# Patient Record
Sex: Female | Born: 1990 | Race: Black or African American | Hispanic: No | Marital: Single | Smoking: Never smoker
Health system: Southern US, Community
[De-identification: ages and names within clinical notes are randomized; demographics above are authoritative.]

## PROBLEM LIST (undated history)

## (undated) DIAGNOSIS — Z8619 Personal history of other infectious and parasitic diseases: Secondary | ICD-10-CM

## (undated) HISTORY — DX: Personal history of other infectious and parasitic diseases: Z86.19

## (undated) HISTORY — PX: MYRINGOTOMY: SUR874

---

## 2011-08-09 NOTE — L&D Delivery Note (Signed)
Delivery Note At 7:23 PM a viable female was delivered via Vaginal, Spontaneous Delivery (Presentation: ;  ).  APGAR: , ; weight .   Placenta status: , Spontaneous.  Cord:  with the following complications: .  Cord pH: not done  Anesthesia:   Episiotomy:  Lacerations: 1st degree;Perineal Suture Repair: 2.0 vicryl Est. Blood Loss (mL): 250  Mom to postpartum.  Baby to nursery-stable.  Tarina Volk A 04/17/2012, 7:30 PM

## 2011-10-14 LAB — OB RESULTS CONSOLE ANTIBODY SCREEN: Antibody Screen: NEGATIVE

## 2011-10-14 LAB — OB RESULTS CONSOLE ABO/RH: RH Type: POSITIVE

## 2011-11-09 ENCOUNTER — Other Ambulatory Visit (HOSPITAL_COMMUNITY): Payer: Self-pay | Admitting: Obstetrics

## 2011-11-09 DIAGNOSIS — Z3689 Encounter for other specified antenatal screening: Secondary | ICD-10-CM

## 2011-11-16 ENCOUNTER — Ambulatory Visit (HOSPITAL_COMMUNITY)
Admission: RE | Admit: 2011-11-16 | Discharge: 2011-11-16 | Disposition: A | Discharge status: Home / Self Care | Payer: Medicaid Other | Source: Ambulatory Visit | Attending: Obstetrics | Admitting: Obstetrics

## 2011-11-16 DIAGNOSIS — E669 Obesity, unspecified: Secondary | ICD-10-CM | POA: Insufficient documentation

## 2011-11-16 DIAGNOSIS — Z1389 Encounter for screening for other disorder: Secondary | ICD-10-CM | POA: Insufficient documentation

## 2011-11-16 DIAGNOSIS — O358XX0 Maternal care for other (suspected) fetal abnormality and damage, not applicable or unspecified: Secondary | ICD-10-CM | POA: Insufficient documentation

## 2011-11-16 DIAGNOSIS — Z363 Encounter for antenatal screening for malformations: Secondary | ICD-10-CM | POA: Insufficient documentation

## 2011-11-16 DIAGNOSIS — O99210 Obesity complicating pregnancy, unspecified trimester: Secondary | ICD-10-CM | POA: Insufficient documentation

## 2011-11-16 DIAGNOSIS — Z3689 Encounter for other specified antenatal screening: Secondary | ICD-10-CM

## 2012-03-02 LAB — OB RESULTS CONSOLE GBS: GBS: POSITIVE

## 2012-04-12 ENCOUNTER — Encounter (HOSPITAL_COMMUNITY): Payer: Self-pay | Admitting: *Deleted

## 2012-04-12 ENCOUNTER — Inpatient Hospital Stay (HOSPITAL_COMMUNITY)
Admission: AD | Admit: 2012-04-12 | Discharge: 2012-04-12 | Disposition: A | Discharge status: Home / Self Care | Payer: 59 | Source: Ambulatory Visit | Attending: Obstetrics | Admitting: Obstetrics

## 2012-04-12 ENCOUNTER — Telehealth (HOSPITAL_COMMUNITY): Payer: Self-pay | Admitting: *Deleted

## 2012-04-12 DIAGNOSIS — W108XXA Fall (on) (from) other stairs and steps, initial encounter: Secondary | ICD-10-CM | POA: Insufficient documentation

## 2012-04-12 DIAGNOSIS — O368190 Decreased fetal movements, unspecified trimester, not applicable or unspecified: Secondary | ICD-10-CM

## 2012-04-12 DIAGNOSIS — Z3689 Encounter for other specified antenatal screening: Secondary | ICD-10-CM

## 2012-04-12 DIAGNOSIS — W19XXXA Unspecified fall, initial encounter: Secondary | ICD-10-CM

## 2012-04-12 NOTE — Telephone Encounter (Signed)
Preadmission screen During interview pt stated she fell last night.  Was seen at [facility] hospital.  They dopplered the fht but the baby had not been moving much since the fall.  Told pt to call Dr Elsie Stain office immediately.  Called MD office 15 minutes later for lab results and spoke to Dr Gaynell Face.  Pt had not called yet and he requested me to notify pt to go to MAU immediately for monitoring.  Pt was notified at 9:28.

## 2012-04-12 NOTE — MAU Note (Signed)
Patient states she was going down stairs and tripped on the second step from the bottom and fell onto her left side. Had been feeling movement until the last couple of hours. Denies contractions, leaking or bleeding.

## 2012-04-12 NOTE — MAU Provider Note (Signed)
  History     CSN: [id number]  Arrival date and time: 04/12/12 1042   First Provider Initiated Contact with Patient 04/12/12 1119      Chief Complaint  Patient presents with  . Fall  . Decreased Fetal Movement   HPI 21 y.o. G1P0 at [redacted]w[redacted]d, fell down stairs yesterday, no pain or bleeding but reports somewhat decreased fetal movement since the fall. Uncomplicated prenatal course.    Past Medical History  Diagnosis Date  . Hx of chlamydia infection     Past Surgical History  Procedure Date  . Myringotomy     Family History  Problem Relation Age of Onset  . Arthritis Mother   . Depression Mother   . Diabetes Mother   . Hearing loss Mother   . Hypertension Mother   . Kidney disease Mother   . Hypertension Father     History  Substance Use Topics  . Smoking status: Never Smoker   . Smokeless tobacco: Never Used  . Alcohol Use: No    Allergies:  Allergies  Allergen Reactions  . Tramadol Swelling    No prescriptions prior to admission    Review of Systems  Constitutional: Negative.   Respiratory: Negative.   Cardiovascular: Negative.   Gastrointestinal: Negative for nausea, vomiting, abdominal pain, diarrhea and constipation.  Genitourinary: Negative for dysuria, urgency, frequency, hematuria and flank pain.       Negative for vaginal bleeding, cramping/contractions  Musculoskeletal: Negative.   Neurological: Negative.   Psychiatric/Behavioral: Negative.    Physical Exam   Blood pressure 124/72, pulse 84, temperature 98.4 F (36.9 C), temperature source Oral, resp. rate 18, height 5\' 6"  (1.676 m), weight 254 lb 6.4 oz (115.395 kg), last menstrual period 07/06/2011, SpO2 100.00%.  Physical Exam  Nursing note and vitals reviewed. Constitutional: She is oriented to person, place, and time. She appears well-developed and well-nourished. No distress.  Cardiovascular: Normal rate and regular rhythm.   Respiratory: Effort normal.  GI: Soft. There is no  tenderness.  Musculoskeletal: Normal range of motion.  Neurological: She is alert and oriented to person, place, and time.  Skin: Skin is warm and dry.  Psychiatric: She has a normal mood and affect.    MAU Course  Procedures  EFM: 140s, mod variability, + accels, no decels, toco quiet  Assessment and Plan  21 y.o. G1P0 at [redacted]w[redacted]d s/p fall Reactive NST, reassuring maternal fetal status Precautions rev'd, f/u as scheduled  Derek Huneycutt 04/12/2012, 11:20 AM

## 2012-04-17 ENCOUNTER — Encounter (HOSPITAL_COMMUNITY): Payer: Self-pay

## 2012-04-17 ENCOUNTER — Inpatient Hospital Stay (HOSPITAL_COMMUNITY)
Admission: RE | Admit: 2012-04-17 | Discharge: 2012-04-19 | DRG: 775 | Disposition: A | Discharge status: Home / Self Care | Payer: 59 | Source: Ambulatory Visit | Attending: Obstetrics | Admitting: Obstetrics

## 2012-04-17 ENCOUNTER — Inpatient Hospital Stay (HOSPITAL_COMMUNITY): Payer: 59 | Admitting: Anesthesiology

## 2012-04-17 ENCOUNTER — Encounter (HOSPITAL_COMMUNITY): Payer: Self-pay | Admitting: Anesthesiology

## 2012-04-17 DIAGNOSIS — Z22330 Carrier of Group B streptococcus: Secondary | ICD-10-CM

## 2012-04-17 DIAGNOSIS — O99892 Other specified diseases and conditions complicating childbirth: Secondary | ICD-10-CM | POA: Diagnosis present

## 2012-04-17 DIAGNOSIS — O480 Post-term pregnancy: Principal | ICD-10-CM | POA: Diagnosis present

## 2012-04-17 LAB — CBC
MCH: 28.4 pg (ref 26.0–34.0)
MCV: 86.8 fL (ref 78.0–100.0)
Platelets: 243 10*3/uL (ref 150–400)
RDW: 14.7 % (ref 11.5–15.5)
WBC: 11.7 10*3/uL — ABNORMAL HIGH (ref 4.0–10.5)

## 2012-04-17 MED ORDER — PENICILLIN G POTASSIUM 5000000 UNITS IJ SOLR
5.0000 10*6.[IU] | Freq: Once | INTRAVENOUS | Status: AC
  Administered 2012-04-17: 5 10*6.[IU] via INTRAVENOUS
  Filled 2012-04-17: qty 5

## 2012-04-17 MED ORDER — OXYTOCIN BOLUS FROM INFUSION
500.0000 mL | Freq: Once | INTRAVENOUS | Status: DC
  Filled 2012-04-17: qty 500

## 2012-04-17 MED ORDER — LIDOCAINE HCL (PF) 1 % IJ SOLN
30.0000 mL | INTRAMUSCULAR | Status: DC | PRN
  Filled 2012-04-17: qty 30

## 2012-04-17 MED ORDER — ONDANSETRON HCL 4 MG/2ML IJ SOLN
4.0000 mg | Freq: Four times a day (QID) | INTRAMUSCULAR | Status: DC | PRN
  Administered 2012-04-17: 4 mg via INTRAVENOUS
  Filled 2012-04-17: qty 2

## 2012-04-17 MED ORDER — FENTANYL 2.5 MCG/ML BUPIVACAINE 1/10 % EPIDURAL INFUSION (WH - ANES)
14.0000 mL/h | INTRAMUSCULAR | Status: DC
  Administered 2012-04-17: 14 mL/h via EPIDURAL
  Filled 2012-04-17: qty 60

## 2012-04-17 MED ORDER — FLEET ENEMA 7-19 GM/118ML RE ENEM: 1.0000 | ENEMA | RECTAL | Status: DC | PRN

## 2012-04-17 MED ORDER — LIDOCAINE HCL (PF) 1 % IJ SOLN
INTRAMUSCULAR | Status: DC | PRN
  Administered 2012-04-17 (×2): 5 mL

## 2012-04-17 MED ORDER — LACTATED RINGERS IV SOLN
500.0000 mL | INTRAVENOUS | Status: DC | PRN
  Administered 2012-04-17: 1000 mL via INTRAVENOUS

## 2012-04-17 MED ORDER — OXYCODONE-ACETAMINOPHEN 5-325 MG PO TABS
1.0000 | ORAL_TABLET | ORAL | Status: DC | PRN
  Filled 2012-04-17: qty 1

## 2012-04-17 MED ORDER — DIBUCAINE 1 % RE OINT
1.0000 "application " | TOPICAL_OINTMENT | RECTAL | Status: DC | PRN
  Filled 2012-04-17: qty 28

## 2012-04-17 MED ORDER — PRENATAL MULTIVITAMIN CH
1.0000 | ORAL_TABLET | Freq: Every day | ORAL | Status: DC
  Administered 2012-04-18 – 2012-04-19 (×2): 1 via ORAL
  Filled 2012-04-17 (×2): qty 1

## 2012-04-17 MED ORDER — DIPHENHYDRAMINE HCL 25 MG PO CAPS: 25.0000 mg | ORAL_CAPSULE | Freq: Four times a day (QID) | ORAL | Status: DC | PRN

## 2012-04-17 MED ORDER — BUTORPHANOL TARTRATE 1 MG/ML IJ SOLN
1.0000 mg | INTRAMUSCULAR | Status: DC | PRN
  Administered 2012-04-17 (×2): 1 mg via INTRAVENOUS
  Filled 2012-04-17 (×3): qty 1

## 2012-04-17 MED ORDER — LACTATED RINGERS IV SOLN
INTRAVENOUS | Status: DC
  Administered 2012-04-17 (×2): via INTRAVENOUS

## 2012-04-17 MED ORDER — OXYTOCIN 40 UNITS IN LACTATED RINGERS INFUSION - SIMPLE MED
62.5000 mL/h | Freq: Once | INTRAVENOUS | Status: DC
  Filled 2012-04-17: qty 1000

## 2012-04-17 MED ORDER — ONDANSETRON HCL 4 MG/2ML IJ SOLN: 4.0000 mg | INTRAMUSCULAR | Status: DC | PRN

## 2012-04-17 MED ORDER — ACETAMINOPHEN 325 MG PO TABS
650.0000 mg | ORAL_TABLET | ORAL | Status: DC | PRN
  Administered 2012-04-18: 650 mg via ORAL
  Filled 2012-04-17: qty 2

## 2012-04-17 MED ORDER — TERBUTALINE SULFATE 1 MG/ML IJ SOLN: 0.2500 mg | Freq: Once | INTRAMUSCULAR | Status: DC | PRN

## 2012-04-17 MED ORDER — WITCH HAZEL-GLYCERIN EX PADS: 1.0000 "application " | MEDICATED_PAD | CUTANEOUS | Status: DC | PRN

## 2012-04-17 MED ORDER — OXYTOCIN 40 UNITS IN LACTATED RINGERS INFUSION - SIMPLE MED
1.0000 m[IU]/min | INTRAVENOUS | Status: DC
  Administered 2012-04-17: 1 m[IU]/min via INTRAVENOUS

## 2012-04-17 MED ORDER — PENICILLIN G POTASSIUM 5000000 UNITS IJ SOLR
2.5000 10*6.[IU] | INTRAVENOUS | Status: DC
  Administered 2012-04-17 (×2): 2.5 10*6.[IU] via INTRAVENOUS
  Filled 2012-04-17 (×4): qty 2.5

## 2012-04-17 MED ORDER — GI COCKTAIL ~~LOC~~
30.0000 mL | Freq: Once | ORAL | Status: AC
  Administered 2012-04-18: 30 mL via ORAL
  Filled 2012-04-17: qty 30

## 2012-04-17 MED ORDER — EPHEDRINE 5 MG/ML INJ: 10.0000 mg | INTRAVENOUS | Status: DC | PRN

## 2012-04-17 MED ORDER — BENZOCAINE-MENTHOL 20-0.5 % EX AERO
1.0000 "application " | INHALATION_SPRAY | CUTANEOUS | Status: DC | PRN
  Filled 2012-04-17 (×2): qty 56

## 2012-04-17 MED ORDER — PHENYLEPHRINE 40 MCG/ML (10ML) SYRINGE FOR IV PUSH (FOR BLOOD PRESSURE SUPPORT): 80.0000 ug | PREFILLED_SYRINGE | INTRAVENOUS | Status: DC | PRN

## 2012-04-17 MED ORDER — DIPHENHYDRAMINE HCL 50 MG/ML IJ SOLN: 12.5000 mg | INTRAMUSCULAR | Status: DC | PRN

## 2012-04-17 MED ORDER — ZOLPIDEM TARTRATE 5 MG PO TABS: 5.0000 mg | ORAL_TABLET | Freq: Every evening | ORAL | Status: DC | PRN

## 2012-04-17 MED ORDER — FERROUS SULFATE 325 (65 FE) MG PO TABS
325.0000 mg | ORAL_TABLET | Freq: Two times a day (BID) | ORAL | Status: DC
  Administered 2012-04-18 – 2012-04-19 (×3): 325 mg via ORAL
  Filled 2012-04-17 (×3): qty 1

## 2012-04-17 MED ORDER — ONDANSETRON HCL 4 MG PO TABS: 4.0000 mg | ORAL_TABLET | ORAL | Status: DC | PRN

## 2012-04-17 MED ORDER — IBUPROFEN 600 MG PO TABS
600.0000 mg | ORAL_TABLET | Freq: Four times a day (QID) | ORAL | Status: DC | PRN
  Filled 2012-04-17: qty 1

## 2012-04-17 MED ORDER — EPHEDRINE 5 MG/ML INJ
10.0000 mg | INTRAVENOUS | Status: DC | PRN
  Filled 2012-04-17: qty 4

## 2012-04-17 MED ORDER — OXYCODONE-ACETAMINOPHEN 5-325 MG PO TABS
1.0000 | ORAL_TABLET | ORAL | Status: DC | PRN
  Administered 2012-04-18: 2 via ORAL
  Administered 2012-04-18: 1 via ORAL
  Filled 2012-04-17: qty 2
  Filled 2012-04-17: qty 1

## 2012-04-17 MED ORDER — IBUPROFEN 600 MG PO TABS: 600.0000 mg | ORAL_TABLET | Freq: Four times a day (QID) | ORAL | Status: DC

## 2012-04-17 MED ORDER — TETANUS-DIPHTH-ACELL PERTUSSIS 5-2.5-18.5 LF-MCG/0.5 IM SUSP
0.5000 mL | Freq: Once | INTRAMUSCULAR | Status: AC
  Administered 2012-04-18: 0.5 mL via INTRAMUSCULAR
  Filled 2012-04-17: qty 0.5

## 2012-04-17 MED ORDER — LANOLIN HYDROUS EX OINT: TOPICAL_OINTMENT | CUTANEOUS | Status: DC | PRN

## 2012-04-17 MED ORDER — LACTATED RINGERS IV SOLN: 500.0000 mL | Freq: Once | INTRAVENOUS | Status: DC

## 2012-04-17 MED ORDER — SENNOSIDES-DOCUSATE SODIUM 8.6-50 MG PO TABS
2.0000 | ORAL_TABLET | Freq: Every day | ORAL | Status: DC
  Administered 2012-04-18: 2 via ORAL

## 2012-04-17 MED ORDER — CITRIC ACID-SODIUM CITRATE 334-500 MG/5ML PO SOLN: 30.0000 mL | ORAL | Status: DC | PRN

## 2012-04-17 MED ORDER — SIMETHICONE 80 MG PO CHEW
80.0000 mg | CHEWABLE_TABLET | ORAL | Status: DC | PRN
  Administered 2012-04-18: 80 mg via ORAL

## 2012-04-17 MED ORDER — PHENYLEPHRINE 40 MCG/ML (10ML) SYRINGE FOR IV PUSH (FOR BLOOD PRESSURE SUPPORT)
80.0000 ug | PREFILLED_SYRINGE | INTRAVENOUS | Status: DC | PRN
  Filled 2012-04-17: qty 5

## 2012-04-17 NOTE — H&P (Signed)
This is Dr. Francoise Ceo dictating the history and physical on  Kelli Rocha she's a 21 year old gravida 1 at 40 weeks and 6 days her EDC is 04/11/2012 positive GBS treated with penicillin she is brought in for induction at 41 weeks cervix 3 cm 70% vertex -2-3 station amniotomy was performed the fluids clear to bloody IUPC was inserted and he is on 9 milliunits of Pitocin with irregular contractions Past medical history negative Past surgical history negative Social history noncontributory System review negative Physical exam reveals an obese female in labor HEENT negative Breasts negative Heart regular rhythm no murmurs no gallops Lungs clear to P&A Abdomen term estimated fetal weight 7 lbs. 7 oz. Pelvic as described above Extremities negative and

## 2012-04-17 NOTE — Anesthesia Procedure Notes (Signed)
Epidural Patient location during procedure: OB Start time: 04/17/2012 4:45 PM  Staffing Anesthesiologist: Brayton Caves R Performed by: anesthesiologist   Preanesthetic Checklist Completed: patient identified, site marked, surgical consent, pre-op evaluation, timeout performed, IV checked, risks and benefits discussed and monitors and equipment checked  Epidural Patient position: sitting Prep: site prepped and draped and DuraPrep Patient monitoring: continuous pulse ox and blood pressure Approach: midline Injection technique: LOR air and LOR saline  Needle:  Needle type: Tuohy  Needle gauge: 17 G Needle length: 9 cm and 9 Needle insertion depth: 8 cm Catheter type: closed end flexible Catheter size: 19 Gauge Catheter at skin depth: 14 cm Test dose: negative  Assessment Events: blood not aspirated, injection not painful, no injection resistance, negative IV test and no paresthesia  Additional Notes Patient identified.  Risk benefits discussed including failed block, incomplete pain control, headache, nerve damage, paralysis, blood pressure changes, nausea, vomiting, reactions to medication both toxic or allergic, and postpartum back pain.  Patient expressed understanding and wished to proceed.  All questions were answered.  Sterile technique used throughout procedure and epidural site dressed with sterile barrier dressing. No paresthesia or other complications noted.The patient did not experience any signs of intravascular injection such as tinnitus or metallic taste in mouth nor signs of intrathecal spread such as rapid motor block. Please see nursing notes for vital signs.

## 2012-04-17 NOTE — Anesthesia Preprocedure Evaluation (Signed)
Anesthesia Evaluation  Patient identified by MRN, date of birth, ID band Patient awake    Reviewed: Allergy & Precautions, H&P , Patient's Chart, lab work & pertinent test results  Airway Mallampati: III TM Distance: >3 FB Neck ROM: full    Dental No notable dental hx.    Pulmonary neg pulmonary ROS,  breath sounds clear to auscultation  Pulmonary exam normal       Cardiovascular negative cardio ROS  Rhythm:regular Rate:Normal     Neuro/Psych negative neurological ROS  negative psych ROS   GI/Hepatic negative GI ROS, Neg liver ROS,   Endo/Other  negative endocrine ROSMorbid obesity  Renal/GU negative Renal ROS     Musculoskeletal   Abdominal   Peds  Hematology negative hematology ROS (+)   Anesthesia Other Findings   Reproductive/Obstetrics (+) Pregnancy                           Anesthesia Physical Anesthesia Plan  ASA: III  Anesthesia Plan: Epidural   Post-op Pain Management:    Induction:   Airway Management Planned:   Additional Equipment:   Intra-op Plan:   Post-operative Plan:   Informed Consent: I have reviewed the patients History and Physical, chart, labs and discussed the procedure including the risks, benefits and alternatives for the proposed anesthesia with the patient or authorized representative who has indicated his/her understanding and acceptance.     Plan Discussed with:   Anesthesia Plan Comments:         Anesthesia Quick Evaluation  

## 2012-04-17 NOTE — Significant Event (Signed)
Rapid Response Event Note  Overview: Time Called: 2335 Arrival Time: 2340 Event Type: Cardiac  Initial Focused Assessment:  Arrived in pt room, pt on left side.  VS obtained.  Respiratory pattern regular, unlabored - appears in no distress.  Color good - no cyanosis.  Skin warm, dry.   Pt states chest discomfort, burning subsided, now aware of discomfort in mid back.   Appears calm, rates pain as 4/10 at present.   Also states hx acid reflux and just ate meal "really fast".  Lungs clear, HRR.   Interventions: Dr. Gaynell Face notified, orders rec'd    Event Summary: Name of Physician Notified: Dr. Gaynell Face at 2345    at          [facility]

## 2012-04-17 NOTE — Progress Notes (Signed)
Delivery of live viable female by Dr Marshall. APGARS 8,9  

## 2012-04-18 ENCOUNTER — Inpatient Hospital Stay (HOSPITAL_COMMUNITY): Payer: 59

## 2012-04-18 LAB — CBC
HCT: 30 % — ABNORMAL LOW (ref 36.0–46.0)
MCH: 28.9 pg (ref 26.0–34.0)
MCV: 87.5 fL (ref 78.0–100.0)
RBC: 3.43 MIL/uL — ABNORMAL LOW (ref 3.87–5.11)
WBC: 15 10*3/uL — ABNORMAL HIGH (ref 4.0–10.5)

## 2012-04-18 MED ORDER — IBUPROFEN 600 MG PO TABS
600.0000 mg | ORAL_TABLET | Freq: Four times a day (QID) | ORAL | Status: DC
  Administered 2012-04-18 – 2012-04-19 (×4): 600 mg via ORAL
  Filled 2012-04-18 (×3): qty 1

## 2012-04-18 NOTE — Significant Event (Signed)
Rapid Response Event Note  Overview: Time Called: 2335 Arrival Time: 2340 Event Type: Cardiac  Initial Focused Assessment:   Interventions:   Event Summary: Name of Physician Notified: Dr. Gaynell Face at 2345    at    Outcome: Stayed in room and stabalized  Event End Time: 0001  Theoren Palka, Alden Benjamin

## 2012-04-18 NOTE — Anesthesia Postprocedure Evaluation (Signed)
  Anesthesia Post-op Note  Patient: Kelli Rocha  Procedure(s) Performed: * No procedures listed *  Patient Location: Mother/Baby  Anesthesia Type: Epidural  Level of Consciousness: awake, alert  and oriented  Airway and Oxygen Therapy: Patient Spontanous Breathing  Post-op Pain: mild  Post-op Assessment: Post-op Vital signs reviewed, Patient's Cardiovascular Status Stable, No headache, No backache, No residual numbness and No residual motor weakness  Post-op Vital Signs: Reviewed and stable  Complications: No apparent anesthesia complications

## 2012-04-18 NOTE — Progress Notes (Signed)
Post Partum Day 1 Subjective: no complaints  Objective: Blood pressure 99/64, pulse 101, temperature 98.4 F (36.9 C), temperature source Oral, resp. rate 18, height 5\' 6"  (1.676 m), weight 115.214 kg (254 lb), last menstrual period 07/06/2011, SpO2 99.00%, unknown if currently breastfeeding.  Physical Exam:  General: alert and no distress Lochia: appropriate Uterine Fundus: firm Incision: none DVT Evaluation: No evidence of DVT seen on physical exam.   Basename 04/18/12 0545 04/17/12 0800  HGB 9.9* 11.4*  HCT 30.0* 34.9*    Assessment/Plan: Plan for discharge tomorrow   LOS: 1 day   Asja Frommer A 04/18/2012, 12:52 PM

## 2012-04-19 MED ORDER — FERROUS SULFATE 325 (65 FE) MG PO TABS: 325.0000 mg | ORAL_TABLET | Freq: Two times a day (BID) | ORAL | Status: DC

## 2012-04-19 MED ORDER — NORETHINDRONE 0.35 MG PO TABS: 1.0000 | ORAL_TABLET | Freq: Every day | ORAL | Status: DC

## 2012-04-19 MED ORDER — OXYCODONE-ACETAMINOPHEN 5-325 MG PO TABS: 1.0000 | ORAL_TABLET | Freq: Four times a day (QID) | ORAL | Status: AC | PRN

## 2012-04-19 MED ORDER — PRENATAL MULTIVITAMIN CH: 1.0000 | ORAL_TABLET | Freq: Every day | ORAL | Status: DC

## 2012-04-19 NOTE — Discharge Summary (Signed)
  Obstetric Discharge Summary Reason for Admission: induction of labor Prenatal Procedures: none Intrapartum Procedures: spontaneous vaginal delivery Postpartum Procedures: none Complications-Operative and Postpartum: none  Hemoglobin  Date Value Range Status  04/18/2012 9.9* 12.0 - 15.0 g/dL Final     HCT  Date Value Range Status  04/18/2012 30.0* 36.0 - 46.0 % Final    Physical Exam:  General: alert Lochia: appropriate Uterine: firm Incision: n/a DVT Evaluation: No evidence of DVT seen on physical exam.  Discharge Diagnoses: Active Problems:  Normal delivery   Discharge Information: Date: 04/19/2012 Activity: pelvic rest Diet: routine Medications:  Prior to Admission medications   Medication Sig Start Date End Date Taking? Authorizing Provider  norethindrone (ORTHO MICRONOR) 0.35 MG tablet Take 1 tablet (0.35 mg total) by mouth daily. 2nd Sunday start 04/19/12 04/19/13  Antionette Char, MD  oxyCODONE-acetaminophen (PERCOCET/ROXICET) 5-325 MG per tablet Take 1-2 tablets by mouth every 6 (six) hours as needed (for pain scale > 4). 04/19/12 04/29/12  Antionette Char, MD    Condition: stable Instructions: refer to routine discharge instructions Discharge to: home Follow-up Information    Follow up with MARSHALL,BERNARD A, MD. Schedule an appointment as soon as possible for a visit in 6 weeks.   Contact information:   [address] [place] [address] [phone]          Newborn Data: Live born  Information for the patient's newborn:  Timi, Reeser Girl Saara [[id number]]  female   Home with mother.  JACKSON-MOORE,Alesandra Smart A 04/19/2012, 8:27 AM

## 2012-04-19 NOTE — Progress Notes (Signed)
Post Partum Day 2 Subjective: no complaints  Objective: Blood pressure 115/52, pulse 70, temperature 98.3 F (36.8 C), temperature source Oral, resp. rate 18, height 5\' 6"  (1.676 m), weight 115.214 kg (254 lb), last menstrual period 07/06/2011, SpO2 99.00%, unknown if currently breastfeeding.  Physical Exam:  General: alert and no distress Lochia: appropriate Uterine Fundus: firm Incision: healing well DVT Evaluation: No evidence of DVT seen on physical exam.   Basename 04/18/12 0545 04/17/12 0800  HGB 9.9* 11.4*  HCT 30.0* 34.9*    Assessment/Plan: Discharge home   LOS: 2 days   Kelli Rocha A 04/19/2012, 8:40 AM

## 2013-07-23 ENCOUNTER — Emergency Department: Payer: Self-pay | Admitting: Emergency Medicine

## 2013-07-23 LAB — URINALYSIS, COMPLETE
Bilirubin,UR: NEGATIVE
Glucose,UR: NEGATIVE mg/dL (ref 0–75)
Ketone: NEGATIVE
Leukocyte Esterase: NEGATIVE
Ph: 6 (ref 4.5–8.0)
RBC,UR: 1 /HPF (ref 0–5)
Squamous Epithelial: 2

## 2014-04-12 ENCOUNTER — Emergency Department: Payer: Self-pay | Admitting: Emergency Medicine

## 2014-04-12 LAB — URINALYSIS, COMPLETE
BILIRUBIN, UR: NEGATIVE
BLOOD: NEGATIVE
Bacteria: NONE SEEN
Glucose,UR: NEGATIVE mg/dL (ref 0–75)
KETONE: NEGATIVE
NITRITE: NEGATIVE
PH: 6 (ref 4.5–8.0)
Protein: NEGATIVE
SPECIFIC GRAVITY: 1.018 (ref 1.003–1.030)
Squamous Epithelial: 8

## 2014-06-03 ENCOUNTER — Emergency Department: Payer: Self-pay | Admitting: Emergency Medicine

## 2014-06-09 ENCOUNTER — Encounter (HOSPITAL_COMMUNITY): Payer: Self-pay

## 2014-10-24 ENCOUNTER — Encounter (HOSPITAL_COMMUNITY): Payer: Self-pay | Admitting: Emergency Medicine

## 2014-10-24 ENCOUNTER — Emergency Department (HOSPITAL_COMMUNITY)
Admission: EM | Admit: 2014-10-24 | Discharge: 2014-10-24 | Disposition: A | Discharge status: Home / Self Care | Payer: Managed Care, Other (non HMO) | Attending: Emergency Medicine | Admitting: Emergency Medicine

## 2014-10-24 ENCOUNTER — Emergency Department (HOSPITAL_COMMUNITY): Payer: Managed Care, Other (non HMO)

## 2014-10-24 DIAGNOSIS — Z8619 Personal history of other infectious and parasitic diseases: Secondary | ICD-10-CM | POA: Diagnosis not present

## 2014-10-24 DIAGNOSIS — Z79899 Other long term (current) drug therapy: Secondary | ICD-10-CM | POA: Diagnosis not present

## 2014-10-24 DIAGNOSIS — Z3202 Encounter for pregnancy test, result negative: Secondary | ICD-10-CM | POA: Diagnosis not present

## 2014-10-24 DIAGNOSIS — R0789 Other chest pain: Secondary | ICD-10-CM | POA: Insufficient documentation

## 2014-10-24 DIAGNOSIS — R079 Chest pain, unspecified: Secondary | ICD-10-CM

## 2014-10-24 LAB — BASIC METABOLIC PANEL
ANION GAP: 7 (ref 5–15)
BUN: 10 mg/dL (ref 6–23)
CALCIUM: 9.1 mg/dL (ref 8.4–10.5)
CO2: 25 mmol/L (ref 19–32)
Chloride: 106 mmol/L (ref 96–112)
Creatinine, Ser: 0.62 mg/dL (ref 0.50–1.10)
Glucose, Bld: 90 mg/dL (ref 70–99)
Potassium: 4.3 mmol/L (ref 3.5–5.1)
SODIUM: 138 mmol/L (ref 135–145)

## 2014-10-24 LAB — CBC
HCT: 37.7 % (ref 36.0–46.0)
Hemoglobin: 12.6 g/dL (ref 12.0–15.0)
MCH: 29.7 pg (ref 26.0–34.0)
MCHC: 33.4 g/dL (ref 30.0–36.0)
MCV: 88.9 fL (ref 78.0–100.0)
PLATELETS: 282 10*3/uL (ref 150–400)
RBC: 4.24 MIL/uL (ref 3.87–5.11)
RDW: 14.3 % (ref 11.5–15.5)
WBC: 13.6 10*3/uL — ABNORMAL HIGH (ref 4.0–10.5)

## 2014-10-24 LAB — I-STAT TROPONIN, ED: TROPONIN I, POC: 0 ng/mL (ref 0.00–0.08)

## 2014-10-24 LAB — POC URINE PREG, ED: Preg Test, Ur: NEGATIVE

## 2014-10-24 MED ORDER — IBUPROFEN 800 MG PO TABS
800.0000 mg | ORAL_TABLET | Freq: Once | ORAL | Status: DC
  Filled 2014-10-24: qty 1

## 2014-10-24 NOTE — ED Notes (Signed)
Patient here with complaint of centralized chest pain which started around midnight while working. States pain goes through to her back and has been constant. Additionally reports that the entire left side of her body went numb after the pain began and that she has intermittent nausea. Denies other recent illness, but states that yesterday her "stomach" was bothering her; but patient reports this morning "her cycle came on". Exertion worsened pain this evening. Denies injury.

## 2014-10-24 NOTE — ED Notes (Signed)
Pt returned from xray

## 2014-10-24 NOTE — Discharge Instructions (Signed)
Take ibuprofen or acetaminophen as needed for pain.  Chest Pain (Nonspecific) It is often hard to give a specific diagnosis for the cause of chest pain. There is always a chance that your pain could be related to something serious, such as a heart attack or a blood clot in the lungs. You need to follow up with your health care provider for further evaluation. CAUSES   Heartburn.  Pneumonia or bronchitis.  Anxiety or stress.  Inflammation around your heart (pericarditis) or lung (pleuritis or pleurisy).  A blood clot in the lung.  A collapsed lung (pneumothorax). It can develop suddenly on its own (spontaneous pneumothorax) or from trauma to the chest.  Shingles infection (herpes zoster virus). The chest wall is composed of bones, muscles, and cartilage. Any of these can be the source of the pain.  The bones can be bruised by injury.  The muscles or cartilage can be strained by coughing or overwork.  The cartilage can be affected by inflammation and become sore (costochondritis). DIAGNOSIS  Lab tests or other studies may be needed to find the cause of your pain. Your health care provider may have you take a test called an ambulatory electrocardiogram (ECG). An ECG records your heartbeat patterns over a 24-hour period. You may also have other tests, such as:  Transthoracic echocardiogram (TTE). During echocardiography, sound waves are used to evaluate how blood flows through your heart.  Transesophageal echocardiogram (TEE).  Cardiac monitoring. This allows your health care provider to monitor your heart rate and rhythm in real time.  Holter monitor. This is a portable device that records your heartbeat and can help diagnose heart arrhythmias. It allows your health care provider to track your heart activity for several days, if needed.  Stress tests by exercise or by giving medicine that makes the heart beat faster. TREATMENT   Treatment depends on what may be causing your chest  pain. Treatment may include:  Acid blockers for heartburn.  Anti-inflammatory medicine.  Pain medicine for inflammatory conditions.  Antibiotics if an infection is present.  You may be advised to change lifestyle habits. This includes stopping smoking and avoiding alcohol, caffeine, and chocolate.  You may be advised to keep your head raised (elevated) when sleeping. This reduces the chance of acid going backward from your stomach into your esophagus. Most of the time, nonspecific chest pain will improve within 2-3 days with rest and mild pain medicine.  HOME CARE INSTRUCTIONS   If antibiotics were prescribed, take them as directed. Finish them even if you start to feel better.  For the next few days, avoid physical activities that bring on chest pain. Continue physical activities as directed.  Do not use any tobacco products, including cigarettes, chewing tobacco, or electronic cigarettes.  Avoid drinking alcohol.  Only take medicine as directed by your health care provider.  Follow your health care provider's suggestions for further testing if your chest pain does not go away.  Keep any follow-up appointments you made. If you do not go to an appointment, you could develop lasting (chronic) problems with pain. If there is any problem keeping an appointment, call to reschedule. SEEK MEDICAL CARE IF:   Your chest pain does not go away, even after treatment.  You have a rash with blisters on your chest.  You have a fever. SEEK IMMEDIATE MEDICAL CARE IF:   You have increased chest pain or pain that spreads to your arm, neck, jaw, back, or abdomen.  You have shortness of breath.  You have an increasing cough, or you cough up blood.  You have severe back or abdominal pain.  You feel nauseous or vomit.  You have severe weakness.  You faint.  You have chills. This is an emergency. Do not wait to see if the pain will go away. Get medical help at once. Call your local  emergency services (911 in [place] Do not drive yourself to the hospital. MAKE SURE YOU:   Understand these instructions.  Will watch your condition.  Will get help right away if you are not doing well or get worse. Document Released: 05/04/2005 Document Revised: 07/30/2013 Document Reviewed: 02/28/2008 [facility] Patient Information 2015 [place], [place]. This information is not intended to replace advice given to you by your health care provider. Make sure you discuss any questions you have with your health care provider.

## 2014-10-24 NOTE — ED Provider Notes (Signed)
CSN: [id number]     Arrival date & time 10/24/14  0124 History   First MD Initiated Contact with Patient 10/24/14 [id number]     Chief Complaint  Patient presents with  . Chest Pain     (Consider location/radiation/quality/duration/timing/severity/associated sxs/prior Treatment) Patient is a 24 y.o. female presenting with chest pain. The history is provided by the patient.  Chest Pain She had onset at about 12:15 AM of midsternal chest pain which radiated into the back. Pain is constant but is worse with movement. It is not affected by breathing. At rest, she rates pain 6/10, but it goes up to 10/10 with movement. She is unable to characterize the pain. There is no associated dyspnea, nausea, diaphoresis. She denies any recent trauma. She has not taken anything to help the pain. She denies fever, chills, sweats. There is no cough. She denies any recent surgery or long distance travel and she denies tobacco use and oral contraceptive use.  Past Medical History  Diagnosis Date  . Hx of chlamydia infection    Past Surgical History  Procedure Laterality Date  . Myringotomy     Family History  Problem Relation Age of Onset  . Arthritis Mother   . Depression Mother   . Diabetes Mother   . Hearing loss Mother   . Hypertension Mother   . Kidney disease Mother   . Hypertension Father    History  Substance Use Topics  . Smoking status: Never Smoker   . Smokeless tobacco: Never Used  . Alcohol Use: No   OB History    Gravida Para Term Preterm AB TAB SAB Ectopic Multiple Living   1 1 1  0 0 0 0 0 0 1     Review of Systems  Cardiovascular: Positive for chest pain.  All other systems reviewed and are negative.     Allergies  Tramadol  Home Medications   Prior to Admission medications   Medication Sig Start Date End Date Taking? Authorizing Provider  ferrous sulfate 325 (65 FE) MG tablet Take 1 tablet (325 mg total) by mouth 2 (two) times daily with a meal. 04/19/12 04/19/13  Antionette CharLisa  Jackson-Moore, MD  norethindrone (ORTHO MICRONOR) 0.35 MG tablet Take 1 tablet (0.35 mg total) by mouth daily. 2nd Sunday start 04/19/12 04/19/13  Antionette CharLisa Jackson-Moore, MD  Prenatal Vit-Fe Fumarate-FA (PRENATAL MULTIVITAMIN) TABS Take 1 tablet by mouth daily. 04/19/12   Antionette CharLisa Jackson-Moore, MD   BP 130/84 mmHg  Pulse 72  Temp(Src) 98.4 F (36.9 C) (Oral)  Resp 20  SpO2 100%  LMP 10/24/2014 (Exact Date) Physical Exam  Nursing note and vitals reviewed.  [age] year old female, resting comfortably and in no acute distress. Vital signs are normal. Oxygen saturation is 100%, which is normal. Head is normocephalic and atraumatic. PERRLA, EOMI. Oropharynx is clear. Neck is nontender and supple without adenopathy or JVD. Back is nontender and there is no CVA tenderness. Lungs are clear without rales, wheezes, or rhonchi. Chest is nontender. Heart has regular rate and rhythm without murmur. Abdomen is soft, flat, nontender without masses or hepatosplenomegaly and peristalsis is normoactive. Extremities have no cyanosis or edema, full range of motion is present. Skin is warm and dry without rash. Neurologic: Mental status is normal, cranial nerves are intact, there are no motor or sensory deficits.  ED Course  Procedures (including critical care time) Labs Review Results for orders placed or performed during the hospital encounter of 10/24/14  CBC  Result Value Ref Range  WBC 13.6 (H) 4.0 - 10.5 K/uL   RBC 4.24 3.87 - 5.11 MIL/uL   Hemoglobin 12.6 12.0 - 15.0 g/dL   HCT 16.1 09.6 - 04.5 %   MCV 88.9 78.0 - 100.0 fL   MCH 29.7 26.0 - 34.0 pg   MCHC 33.4 30.0 - 36.0 g/dL   RDW 40.9 81.1 - 91.4 %   Platelets 282 150 - 400 K/uL  Basic metabolic panel  Result Value Ref Range   Sodium 138 135 - 145 mmol/L   Potassium 4.3 3.5 - 5.1 mmol/L   Chloride 106 96 - 112 mmol/L   CO2 25 19 - 32 mmol/L   Glucose, Bld 90 70 - 99 mg/dL   BUN 10 6 - 23 mg/dL   Creatinine, Ser 7.82 0.50 - 1.10 mg/dL    Calcium 9.1 8.4 - 95.6 mg/dL   GFR calc non Af Amer >90 >90 mL/min   GFR calc Af Amer >90 >90 mL/min   Anion gap 7 5 - 15  I-stat troponin, ED (not at [facility])  Result Value Ref Range   Troponin i, poc 0.00 0.00 - 0.08 ng/mL   Comment 3          POC Urine Pregnancy, ED (pre-menopausal females) - do not order at [facility]  Result Value Ref Range   Preg Test, Ur NEGATIVE NEGATIVE    Imaging Review Dg Chest 2 View  10/24/2014   CLINICAL DATA:  Chest pain and dyspnea  EXAM: CHEST  2 VIEW  COMPARISON:  07/25/2013  FINDINGS: The heart size and mediastinal contours are within normal limits. Both lungs are clear. The visualized skeletal structures are unremarkable.  IMPRESSION: No active cardiopulmonary disease.   Electronically Signed   By: Ellery Plunk M.D.   On: 10/24/2014 05:33     EKG Interpretation   Date/Time:  Friday October 24 2014 01:29:16 EDT Ventricular Rate:  84 PR Interval:  158 QRS Duration: 84 QT Interval:  392 QTC Calculation: 463 R Axis:   77 Text Interpretation:  Normal sinus rhythm with sinus arrhythmia Normal ECG  No old tracing to compare Confirmed by [facility]  MD, Solyana Nonaka ([id number]) on  10/24/2014 3:57:21 AM      MDM   Final diagnoses:  Chest pain, unspecified chest pain type    Chest pain of uncertain cause. No red flags to suggest serious pathology. She'll be given a trial of ibuprofen and chest x-ray obtained. ECG is normal and troponin is normal.  Patient refused ibuprofen because she is on her menses and is worried about it making her bleed more. Chest x-ray is unremarkable. She is reassured and advised to use over-the-counter analgesics as needed for pain control.  Dione Booze, MD 10/24/14 [phone]

## 2015-03-10 ENCOUNTER — Emergency Department
Admission: EM | Admit: 2015-03-10 | Discharge: 2015-03-10 | Disposition: A | Discharge status: Home / Self Care | Payer: Managed Care, Other (non HMO) | Attending: Emergency Medicine | Admitting: Emergency Medicine

## 2015-03-10 ENCOUNTER — Encounter: Payer: Self-pay | Admitting: Medical Oncology

## 2015-03-10 DIAGNOSIS — Y9389 Activity, other specified: Secondary | ICD-10-CM | POA: Diagnosis not present

## 2015-03-10 DIAGNOSIS — W228XXA Striking against or struck by other objects, initial encounter: Secondary | ICD-10-CM | POA: Insufficient documentation

## 2015-03-10 DIAGNOSIS — G44209 Tension-type headache, unspecified, not intractable: Secondary | ICD-10-CM

## 2015-03-10 DIAGNOSIS — Y998 Other external cause status: Secondary | ICD-10-CM | POA: Insufficient documentation

## 2015-03-10 DIAGNOSIS — Z79899 Other long term (current) drug therapy: Secondary | ICD-10-CM | POA: Insufficient documentation

## 2015-03-10 DIAGNOSIS — S0990XA Unspecified injury of head, initial encounter: Secondary | ICD-10-CM | POA: Diagnosis present

## 2015-03-10 DIAGNOSIS — Y9289 Other specified places as the place of occurrence of the external cause: Secondary | ICD-10-CM | POA: Diagnosis not present

## 2015-03-10 MED ORDER — BUTALBITAL-APAP-CAFFEINE 50-325-40 MG PO TABS
1.0000 | ORAL_TABLET | Freq: Once | ORAL | Status: AC
  Administered 2015-03-10: 1 via ORAL
  Filled 2015-03-10: qty 1

## 2015-03-10 MED ORDER — BUTALBITAL-APAP-CAFFEINE 50-325-40 MG PO TABS: 1.0000 | ORAL_TABLET | Freq: Four times a day (QID) | ORAL | Status: AC | PRN

## 2015-03-10 NOTE — ED Provider Notes (Signed)
[facility] Emergency Department Provider Note ____________________________________________  Time seen: Approximately 8:23 PM  I have reviewed the triage vital signs and the nursing notes.   HISTORY  Chief Complaint Headache  HPI Kelli Rocha is a 24 y.o. female presents to the emergency department for evaluation of headache. She states she hit her head on the panel of the car door on Saturday and has had headaches daily since. She denies loss of consciousness at that time. She denies confusion, dizziness, nausea, vomiting, photophobia, or sensitivity to sound.  Location: Frontal Similar to previous headaches: Yes Duration: Intermittent through the day TIMING: After striking her head on the door on Saturday she awoke with a headache the next morning. SEVERITY: Not the worse headache of her life QUALITY: Throbbing/ache CONTEXT: No exposure MODIFYING FACTORS: Temporary relief with ibuprofen ASSOCIATED SYMPTOMS: None Past Medical History  Diagnosis Date  . Hx of chlamydia infection     Patient Active Problem List   Diagnosis Date Noted  . Normal delivery 04/19/2012    Past Surgical History  Procedure Laterality Date  . Myringotomy      Current Outpatient Rx  Name  Route  Sig  Dispense  Refill  . butalbital-acetaminophen-caffeine (FIORICET) 50-325-40 MG per tablet   Oral   Take 1-2 tablets by mouth every 6 (six) hours as needed for headache.   12 tablet   0   . EXPIRED: ferrous sulfate 325 (65 FE) MG tablet   Oral   Take 1 tablet (325 mg total) by mouth 2 (two) times daily with a meal. Patient not taking: Reported on 10/24/2014   50 tablet   5   . EXPIRED: norethindrone (ORTHO MICRONOR) 0.35 MG tablet   Oral   Take 1 tablet (0.35 mg total) by mouth daily. 2nd Sunday start Patient not taking: Reported on 10/24/2014   28 tablet   11   . Prenatal Vit-Fe Fumarate-FA (PRENATAL MULTIVITAMIN) TABS   Oral   Take 1 tablet by mouth  daily. Patient not taking: Reported on 10/24/2014   60 tablet   5     Allergies Tramadol  Family History  Problem Relation Age of Onset  . Arthritis Mother   . Depression Mother   . Diabetes Mother   . Hearing loss Mother   . Hypertension Mother   . Kidney disease Mother   . Hypertension Father     Social History History  Substance Use Topics  . Smoking status: Never Smoker   . Smokeless tobacco: Never Used  . Alcohol Use: Yes     Comment: occ    Review of Systems Constitutional: No fever/chills Eyes: No visual changes. ENT: No sore throat. Cardiovascular: Denies chest pain. Respiratory: Denies shortness of breath. Gastrointestinal: No abdominal pain.  No nausea, no vomiting.  No diarrhea.  No constipation. Genitourinary: Negative for dysuria or incontinence. Musculoskeletal: Negative for pain. Skin: Negative for rash. Neurological: Negative for headaches, focal weakness or numbness. No confusion or fainting. Psychiatric:No anxiety or depression  10-point ROS otherwise negative.  ____________________________________________   PHYSICAL EXAM:  VITAL SIGNS: ED Triage Vitals  Enc Vitals Group     BP 03/10/15 1854 139/70 mmHg     Pulse Rate 03/10/15 1854 70     Resp 03/10/15 1854 18     Temp 03/10/15 1854 97.8 F (36.6 C)     Temp Source 03/10/15 1854 Oral     SpO2 03/10/15 1854 99 %     Weight 03/10/15 1854 270 lb (122.471  kg)     Height 03/10/15 1854  (1.676 m)     Head Cir --      Peak Flow --      Pain Score 03/10/15 1855 6     Pain Loc --      Pain Edu? --      Excl. in GC? --     Constitutional: Alert and oriented. Well appearing and in no acute distress. Eyes: Conjunctivae are normal. PERRL. EOMI. Head: Atraumatic. Nose: No congestion/rhinnorhea. Mouth/Throat: Mucous membranes are moist.  Oropharynx non-erythematous. Neck: No stridor.   : No cervical lymphadenopathy. Cardiovascular: Normal rate, regular rhythm. Grossly normal heart  sounds.  Good peripheral circulation. Respiratory: Normal respiratory effort.  No retractions. Lungs CTAB. Gastrointestinal: Soft and nontender. No distention. No abdominal bruits. No CVA tenderness. Musculoskeletal: No lower extremity tenderness nor edema.  No joint effusions. Neurologic:  Normal speech and language. No gross focal neurologic deficits are appreciated. No gait instability.  Cranial nerves: 2-10 normal as tested.  Cerebellar: Normal Romberg, finger-nose-finger, normal gait. Sensorimotor: No aphasia, pronator drift, clonus, sensory loss or abnormal reflexes.  Skin:  Skin is warm, dry and intact. No rash noted. Psychiatric: Mood and affect are normal. Speech and behavior are normal. Normal thought process and cognition.  ____________________________________________   LABS (all labs ordered are listed, but only abnormal results are displayed)  Labs Reviewed - No data to display ____________________________________________  EKG   ____________________________________________  RADIOLOGY  Not indicated ____________________________________________   PROCEDURES  Procedure(s) performed: None  Critical Care performed: No  ____________________________________________   INITIAL IMPRESSION / ASSESSMENT AND PLAN / ED COURSE  Pertinent labs & imaging results that were available during my care of the patient were reviewed by me and considered in my medical decision making (see chart for details).  Patient was advised to follow up with the primary care provider or neurologist for symptoms that are not relieved or improved over the next 24 hours. Also advised to return to the emergency department for symptoms that change or worsen if unable to schedule an appointment. ____________________________________________   FINAL CLINICAL IMPRESSION(S) / ED DIAGNOSES  Headache  Chinita Pester, FNP 03/10/15 2025  Minna Antis, MD 03/10/15 2230

## 2015-03-10 NOTE — ED Notes (Signed)
Pt reports she hit the back of her head Saturday on her car door and since then has been having headaches off and on. Denies LOC. Pt NAD.

## 2015-03-10 NOTE — Discharge Instructions (Signed)

## 2015-08-26 ENCOUNTER — Encounter: Payer: Self-pay | Admitting: Emergency Medicine

## 2015-08-26 ENCOUNTER — Emergency Department
Admission: EM | Admit: 2015-08-26 | Discharge: 2015-08-26 | Disposition: A | Discharge status: Home / Self Care | Payer: Managed Care, Other (non HMO) | Attending: Emergency Medicine | Admitting: Emergency Medicine

## 2015-08-26 DIAGNOSIS — Y990 Civilian activity done for income or pay: Secondary | ICD-10-CM | POA: Insufficient documentation

## 2015-08-26 DIAGNOSIS — Z79899 Other long term (current) drug therapy: Secondary | ICD-10-CM | POA: Insufficient documentation

## 2015-08-26 DIAGNOSIS — L989 Disorder of the skin and subcutaneous tissue, unspecified: Secondary | ICD-10-CM | POA: Diagnosis not present

## 2015-08-26 DIAGNOSIS — M79674 Pain in right toe(s): Secondary | ICD-10-CM | POA: Diagnosis present

## 2015-08-26 DIAGNOSIS — S90424A Blister (nonthermal), right lesser toe(s), initial encounter: Secondary | ICD-10-CM

## 2015-08-26 MED ORDER — SULFAMETHOXAZOLE-TRIMETHOPRIM 800-160 MG PO TABS: 1.0000 | ORAL_TABLET | Freq: Two times a day (BID) | ORAL | Status: DC

## 2015-08-26 MED ORDER — CLOTRIMAZOLE 1 % EX CREA: 1.0000 "application " | TOPICAL_CREAM | Freq: Two times a day (BID) | CUTANEOUS | Status: DC

## 2015-08-26 NOTE — Discharge Instructions (Signed)
Take bactrim twice daily for 5 days.   Apply clotrimazole to the foot twice daily.   Keep foot dry. You may put some baby powder into the shoes and avoid close toe shoes.   See your doctor.   Return to ER if you have worse swelling, fever, pain.

## 2015-08-26 NOTE — ED Notes (Signed)
Patient ambulatory to triage with steady gait, without difficulty or distress noted; pt reports swelling/pain to right 3rd toe with unknown injury

## 2015-08-26 NOTE — ED Provider Notes (Signed)
CSN: [id number]     Arrival date & time 08/26/15  4098 History   First MD Initiated Contact with Patient 08/26/15 0703     Chief Complaint  Patient presents with  . Toe Pain     (Consider location/radiation/quality/duration/timing/severity/associated sxs/prior Treatment) The history is provided by the patient.  Devaeh Karan is a 25 y.o. female here presenting with right third toe redness. Patient states that she works at [employer] and wears closed [employer] all the time. Patient noticed a blister on the right third toe several days ago was itchy. Yesterday, she popped it with a safety pin and now is draining. Denies any fevers or chills. Denies being diabetic and never had skin infections before.    Past Medical History  Diagnosis Date  . Hx of chlamydia infection    Past Surgical History  Procedure Laterality Date  . Myringotomy     Family History  Problem Relation Age of Onset  . Arthritis Mother   . Depression Mother   . Diabetes Mother   . Hearing loss Mother   . Hypertension Mother   . Kidney disease Mother   . Hypertension Father    Social History  Substance Use Topics  . Smoking status: Never Smoker   . Smokeless tobacco: Never Used  . Alcohol Use: Yes     Comment: occ   OB History    Gravida Para Term Preterm AB TAB SAB Ectopic Multiple Living   0 0 0 0 0 0 1     Review of Systems  Skin: Positive for wound.  All other systems reviewed and are negative.     Allergies  Tramadol  Home Medications   Prior to Admission medications   Medication Sig Start Date End Date Taking? Authorizing Provider  butalbital-acetaminophen-caffeine (FIORICET) 50-325-40 MG per tablet Take 1-2 tablets by mouth every 6 (six) hours as needed for headache. 03/10/15 03/09/16  Chinita Pester, FNP  ferrous sulfate 325 (65 FE) MG tablet Take 1 tablet (325 mg total) by mouth 2 (two) times daily with a meal. Patient not taking: Reported on 10/24/2014 04/19/12 04/19/13  Antionette Char, MD  norethindrone (ORTHO MICRONOR) 0.35 MG tablet Take 1 tablet (0.35 mg total) by mouth daily. 2nd Sunday start Patient not taking: Reported on 10/24/2014 04/19/12 04/19/13  Antionette Char, MD  Prenatal Vit-Fe Fumarate-FA (PRENATAL MULTIVITAMIN) TABS Take 1 tablet by mouth daily. Patient not taking: Reported on 10/24/2014 04/19/12   Antionette Char, MD   BP 132/71 mmHg  Pulse 88  Temp(Src) 98.1 F (36.7 C) (Oral)  Resp 20  Ht  (1.676 m)  Wt 270 lb (122.471 kg)  BMI 43.60 kg/m2  SpO2 98%  LMP 08/03/2015 (Approximate) Physical Exam  Constitutional: She is oriented to person, place, and time. She appears well-developed.  HENT:  Head: Normocephalic.  Eyes: Pupils are equal, round, and reactive to light.  Neck: Normal range of motion.  Cardiovascular: Normal rate.   Pulmonary/Chest: Effort normal.  Abdominal: Soft.  Musculoskeletal:  R 3rd toe slightly red and swollen, blister on the plantar side that is draining clear fluid. Blister extends to plantar side of R4th toe but no 4th toe swelling. Nl capillary refill   Neurological: She is alert and oriented to person, place, and time.  Skin: Skin is dry.  Psychiatric: She has a normal mood and affect. Her behavior is normal. Judgment and thought content normal.  Nursing note and vitals reviewed.   ED Course  Procedures (including critical care time) Labs Review Labs Reviewed - No data to display  Imaging Review No results found. I have personally reviewed and evaluated these images and lab results as part of my medical decision-making.   EKG Interpretation None      MDM   Final diagnoses:  None    Haidynn Riddle is a 25 y.o. female here with R 3rd toe blister that is draining. No hx of diabetes. I think likely fungal vs early cellulitis. It is draining clear fluid so I doubt abscess. Will dc home with bactrim empirically, clotrimazole cream, recommend applying baby powder into the shoes to keep it  dry.     Richardean Canal, MD 08/26/15 [phone]

## 2015-08-26 NOTE — ED Notes (Addendum)
Pt ambulatory to room with steady gait. All extremities with active ROM. Pt wearing flip flops. Pt states that she does not know what happened to toe. It is right 3rd toe. Pt states few days ago she noticed it was itchy, red, purple, and swollen. Pt states she noticed a bump on toe. Noticed liquid to underneath toe, looks as if bump that pt was describing has opened and fluid was inside. Appears that where bump that pt is describing was slightly underneath toe and inbetween toes. Pt states it hurts to walk on, it is itchy.  Child at bedside.

## 2015-11-25 ENCOUNTER — Encounter: Payer: Self-pay | Admitting: *Deleted

## 2015-11-25 ENCOUNTER — Emergency Department
Admission: EM | Admit: 2015-11-25 | Discharge: 2015-11-26 | Disposition: A | Discharge status: Home / Self Care | Payer: Managed Care, Other (non HMO) | Attending: Emergency Medicine | Admitting: Emergency Medicine

## 2015-11-25 DIAGNOSIS — R1084 Generalized abdominal pain: Secondary | ICD-10-CM | POA: Diagnosis present

## 2015-11-25 DIAGNOSIS — N390 Urinary tract infection, site not specified: Secondary | ICD-10-CM | POA: Insufficient documentation

## 2015-11-25 DIAGNOSIS — R42 Dizziness and giddiness: Secondary | ICD-10-CM

## 2015-11-25 LAB — LIPASE, BLOOD: Lipase: 17 U/L (ref 11–51)

## 2015-11-25 LAB — URINALYSIS COMPLETE WITH MICROSCOPIC (ARMC ONLY)
Bacteria, UA: NONE SEEN
Bilirubin Urine: NEGATIVE
Glucose, UA: NEGATIVE mg/dL
Hgb urine dipstick: NEGATIVE
Ketones, ur: NEGATIVE mg/dL
Nitrite: NEGATIVE
PH: 6 (ref 5.0–8.0)
Protein, ur: NEGATIVE mg/dL
Specific Gravity, Urine: 1.021 (ref 1.005–1.030)

## 2015-11-25 LAB — COMPREHENSIVE METABOLIC PANEL
ALBUMIN: 4.4 g/dL (ref 3.5–5.0)
ALT: 17 U/L (ref 14–54)
AST: 20 U/L (ref 15–41)
Alkaline Phosphatase: 84 U/L (ref 38–126)
Anion gap: 9 (ref 5–15)
BUN: 9 mg/dL (ref 6–20)
CHLORIDE: 104 mmol/L (ref 101–111)
CO2: 25 mmol/L (ref 22–32)
CREATININE: 0.62 mg/dL (ref 0.44–1.00)
Calcium: 9 mg/dL (ref 8.9–10.3)
GFR calc non Af Amer: >60 mL/min (ref >60)
Glucose, Bld: 89 mg/dL (ref 65–99)
Potassium: 3.5 mmol/L (ref 3.5–5.1)
SODIUM: 138 mmol/L (ref 135–145)
Total Bilirubin: 0.3 mg/dL (ref 0.3–1.2)
Total Protein: 7.9 g/dL (ref 6.5–8.1)

## 2015-11-25 LAB — CBC
HCT: 37.6 % (ref 35.0–47.0)
Hemoglobin: 12.6 g/dL (ref 12.0–16.0)
MCH: 29.1 pg (ref 26.0–34.0)
MCHC: 33.4 g/dL (ref 32.0–36.0)
MCV: 87.1 fL (ref 80.0–100.0)
PLATELETS: 267 10*3/uL (ref 150–440)
RBC: 4.32 MIL/uL (ref 3.80–5.20)
RDW: 13.8 % (ref 11.5–14.5)
WBC: 14.5 10*3/uL — ABNORMAL HIGH (ref 3.6–11.0)

## 2015-11-25 LAB — POCT PREGNANCY, URINE: PREG TEST UR: NEGATIVE

## 2015-11-25 MED ORDER — ONDANSETRON 4 MG PO TBDP
4.0000 mg | ORAL_TABLET | Freq: Once | ORAL | Status: AC
  Administered 2015-11-25: 4 mg via ORAL

## 2015-11-25 MED ORDER — ONDANSETRON 4 MG PO TBDP
ORAL_TABLET | ORAL | Status: AC
  Administered 2015-11-25: 4 mg via ORAL
  Filled 2015-11-25: qty 1

## 2015-11-25 NOTE — ED Notes (Signed)
Pt reports abd pain with n/v.  Pt also reports she has motion sickness and has felt bad since yesterday.  No vag bleeding.  No dysuria.  Pt alert.

## 2015-11-25 NOTE — ED Provider Notes (Signed)
[facility] Emergency Department Provider Note  ____________________________________________  Time seen: Approximately 11:58 PM  I have reviewed the triage vital signs and the nursing notes.   HISTORY  Chief Complaint Abdominal Pain    HPI Kelli Rocha is a 25 y.o. female who presents to the ED from home with a chief complaint of headache, vertigo, abdominal pain and vomiting. Patient reports gradual onsets headache with vertigo (sensation of motion sickness) yesterday. Awoke today with generalized abdominal pain and had 1 episode of vomiting approximately 10 PM. Complains of environmental allergies. Denies associated symptoms of fever, chills, neck pain, vision changes, chest pain, shortness of breath, diarrhea, dysuria, constipation. Denies vaginal bleeding or discharge. Denies recent travel or trauma. Nothing makes her symptoms better; moving her head makes her symptoms worse.   Past Medical History  Diagnosis Date  . Hx of chlamydia infection     Patient Active Problem List   Diagnosis Date Noted  . Normal delivery 04/19/2012    Past Surgical History  Procedure Laterality Date  . Myringotomy      Current Outpatient Rx  Name  Route  Sig  Dispense  Refill  . butalbital-acetaminophen-caffeine (FIORICET) 50-325-40 MG per tablet   Oral   Take 1-2 tablets by mouth every 6 (six) hours as needed for headache.   12 tablet   0   . clotrimazole (LOTRIMIN) 1 % cream   Topical   Apply 1 application topically 2 (two) times daily.   30 g   0   . EXPIRED: ferrous sulfate 325 (65 FE) MG tablet   Oral   Take 1 tablet (325 mg total) by mouth 2 (two) times daily with a meal. Patient not taking: Reported on 10/24/2014   50 tablet   5   . EXPIRED: norethindrone (ORTHO MICRONOR) 0.35 MG tablet   Oral   Take 1 tablet (0.35 mg total) by mouth daily. 2nd Sunday start Patient not taking: Reported on 10/24/2014   28 tablet   11   . Prenatal Vit-Fe  Fumarate-FA (PRENATAL MULTIVITAMIN) TABS   Oral   Take 1 tablet by mouth daily. Patient not taking: Reported on 10/24/2014   60 tablet   5   . sulfamethoxazole-trimethoprim (BACTRIM DS,SEPTRA DS) 800-160 MG tablet   Oral   Take 1 tablet by mouth 2 (two) times daily.   10 tablet   0     Allergies Tramadol  Family History  Problem Relation Age of Onset  . Arthritis Mother   . Depression Mother   . Diabetes Mother   . Hearing loss Mother   . Hypertension Mother   . Kidney disease Mother   . Hypertension Father     Social History Social History  Substance Use Topics  . Smoking status: Never Smoker   . Smokeless tobacco: Never Used  . Alcohol Use: Yes     Comment: occ    Review of Systems  Constitutional: No fever/chills. Eyes: No visual changes. ENT: No sore throat. Cardiovascular: Denies chest pain. Respiratory: Denies shortness of breath. Gastrointestinal: Positive for abdominal pain, nausea and vomiting.  No diarrhea.  No constipation. Genitourinary: Negative for dysuria. Musculoskeletal: Negative for back pain. Skin: Negative for rash. Neurological: Positive for headache. Negative for focal weakness or numbness.  10-point ROS otherwise negative.  ____________________________________________   PHYSICAL EXAM:  VITAL SIGNS: ED Triage Vitals  Enc Vitals Group     BP 11/25/15 2112 134/82 mmHg     Pulse Rate 11/25/15 2110 74  Resp 11/25/15 2110 22     Temp 11/25/15 2110 97.7 F (36.5 C)     Temp Source 11/25/15 2110 Oral     SpO2 11/25/15 2110 100 %     Weight 11/25/15 2110 270 lb (122.471 kg)     Height 11/25/15 2110 5\' 5"  (1.651 m)     Head Cir --      Peak Flow --      Pain Score 11/25/15 2111 7     Pain Loc --      Pain Edu? --      Excl. in GC? --     Constitutional: Alert and oriented. Well appearing and in no acute distress. Eyes: Conjunctivae are normal. PERRL. EOMI. Head: Atraumatic. Nose: No congestion/rhinnorhea. Mouth/Throat:  Mucous membranes are moist.  Oropharynx non-erythematous. Neck: No stridor.  Supple neck without meningismus. Cardiovascular: Normal rate, regular rhythm. Grossly normal heart sounds.  Good peripheral circulation. Respiratory: Normal respiratory effort.  No retractions. Lungs CTAB. Gastrointestinal: Obese. Soft and nontender to light and deep palpation. No distention. No abdominal bruits. No CVA tenderness. Musculoskeletal: No lower extremity tenderness nor edema.  No joint effusions. Neurologic:  Normal speech and language. No gross focal neurologic deficits are appreciated. No gait instability. Skin:  Skin is warm, dry and intact. No rash noted. Psychiatric: Mood and affect are normal. Speech and behavior are normal.  ____________________________________________   LABS (all labs ordered are listed, but only abnormal results are displayed)  Labs Reviewed  CBC - Abnormal; Notable for the following:    WBC 14.5 (*)    All other components within normal limits  URINALYSIS COMPLETEWITH MICROSCOPIC ([facility] ONLY) - Abnormal; Notable for the following:    Color, Urine YELLOW (*)    APPearance HAZY (*)    Leukocytes, UA TRACE (*)    Squamous Epithelial / LPF 0-5 (*)    All other components within normal limits  COMPREHENSIVE METABOLIC PANEL  LIPASE, BLOOD  POCT PREGNANCY, URINE  POC URINE PREG, ED   ____________________________________________  EKG  None ____________________________________________  RADIOLOGY  None ____________________________________________   PROCEDURES  Procedure(s) performed: None  Critical Care performed: No  ____________________________________________   INITIAL IMPRESSION / ASSESSMENT AND PLAN / ED COURSE  Pertinent labs & imaging results that were available during my care of the patient were reviewed by me and considered in my medical decision making (see chart for details).  25 year old otherwise healthy female who presents with vertigo  symptoms, abdominal pain and nausea/vomiting. Laboratory urinalysis notable for mild leukocytosis and trace leukocytes in her urine. Will administer IV fluid bolus, IV antiemetic, IV Valium for dizziness and reassess.  ----------------------------------------- 3:16 AM on 11/26/2015 -----------------------------------------  Abdominal pain and dizziness are much improved. Patient ate sandwich tray without difficulty. Plan for prescriptions for Antivert, Zofran to be used as needed for vertigo symptoms; Cipro 3 days for mild UTI. Strict return precautions given. Patient verbalizes understanding and agrees with plan of care. ____________________________________________   FINAL CLINICAL IMPRESSION(S) / ED DIAGNOSES  Final diagnoses:  Vertigo  UTI (lower urinary tract infection)  Generalized abdominal pain      Irean HongJade J Sung, MD 11/26/15 [phone]

## 2015-11-26 MED ORDER — CIPROFLOXACIN HCL 500 MG PO TABS
500.0000 mg | ORAL_TABLET | Freq: Once | ORAL | Status: AC
  Administered 2015-11-26: 500 mg via ORAL
  Filled 2015-11-26: qty 1

## 2015-11-26 MED ORDER — SODIUM CHLORIDE 0.9 % IV BOLUS (SEPSIS)
1000.0000 mL | Freq: Once | INTRAVENOUS | Status: AC
  Administered 2015-11-26: 1000 mL via INTRAVENOUS

## 2015-11-26 MED ORDER — MECLIZINE HCL 25 MG PO TABS: 25.0000 mg | ORAL_TABLET | Freq: Three times a day (TID) | ORAL | Status: DC | PRN

## 2015-11-26 MED ORDER — ONDANSETRON HCL 4 MG/2ML IJ SOLN
4.0000 mg | Freq: Once | INTRAMUSCULAR | Status: AC
  Administered 2015-11-26: 4 mg via INTRAVENOUS
  Filled 2015-11-26: qty 2

## 2015-11-26 MED ORDER — CIPROFLOXACIN HCL 500 MG PO TABS: 500.0000 mg | ORAL_TABLET | Freq: Two times a day (BID) | ORAL | Status: DC

## 2015-11-26 MED ORDER — MECLIZINE HCL 25 MG PO TABS
25.0000 mg | ORAL_TABLET | Freq: Once | ORAL | Status: AC
  Administered 2015-11-26: 25 mg via ORAL
  Filled 2015-11-26: qty 1

## 2015-11-26 MED ORDER — ONDANSETRON 4 MG PO TBDP: 4.0000 mg | ORAL_TABLET | Freq: Three times a day (TID) | ORAL | Status: DC | PRN

## 2015-11-26 MED ORDER — DIAZEPAM 5 MG/ML IJ SOLN
2.0000 mg | Freq: Once | INTRAMUSCULAR | Status: AC
  Administered 2015-11-26: 2 mg via INTRAVENOUS
  Filled 2015-11-26: qty 2

## 2015-11-26 NOTE — ED Notes (Signed)
Pt reports abdominal pain and n/v x 24 hours. Pt denies any urinary symptoms and is in no acute distress at this time

## 2015-11-26 NOTE — Discharge Instructions (Signed)
1. Take medicines as needed for dizziness and nausea (antivert/zofran #20). 2. Take antibiotic as prescribed (Cipro 500mg  twice daily 3 days). 3. Return to the ER for worsening symptoms, persistent vomiting, difficulty breathing or other concerns.  Dizziness Dizziness is a common problem. It is a feeling of unsteadiness or light-headedness. You may feel like you are about to faint. Dizziness can lead to injury if you stumble or fall. Anyone can become dizzy, but dizziness is more common in older adults. This condition can be caused by a number of things, including medicines, dehydration, or illness. HOME CARE INSTRUCTIONS Taking these steps may help with your condition: Eating and Drinking  Drink enough fluid to keep your urine clear or pale yellow. This helps to keep you from becoming dehydrated. Try to drink more clear fluids, such as water.  Do not drink alcohol.  Limit your caffeine intake if directed by your health care provider.  Limit your salt intake if directed by your health care provider. Activity  Avoid making quick movements.  Rise slowly from chairs and steady yourself until you feel okay.  In the morning, first sit up on the side of the bed. When you feel okay, stand slowly while you hold onto something until you know that your balance is fine.  Move your legs often if you need to stand in one place for a long time. Tighten and relax your muscles in your legs while you are standing.  Do not drive or operate heavy machinery if you feel dizzy.  Avoid bending down if you feel dizzy. Place items in your home so that they are easy for you to reach without leaning over. Lifestyle  Do not use any tobacco products, including cigarettes, chewing tobacco, or electronic cigarettes. If you need help quitting, ask your health care provider.  Try to reduce your stress level, such as with yoga or meditation. Talk with your health care provider if you need help. General  Instructions  Watch your dizziness for any changes.  Take medicines only as directed by your health care provider. Talk with your health care provider if you think that your dizziness is caused by a medicine that you are taking.  Tell a friend or a family member that you are feeling dizzy. If he or she notices any changes in your behavior, have this person call your health care provider.  Keep all follow-up visits as directed by your health care provider. This is important. SEEK MEDICAL CARE IF:  Your dizziness does not go away.  Your dizziness or light-headedness gets worse.  You feel nauseous.  You have reduced hearing.  You have new symptoms.  You are unsteady on your feet or you feel like the room is spinning. SEEK IMMEDIATE MEDICAL CARE IF:  You vomit or have diarrhea and are unable to eat or drink anything.  You have problems talking, walking, swallowing, or using your arms, hands, or legs.  You feel generally weak.  You are not thinking clearly or you have trouble forming sentences. It may take a friend or family member to notice this.  You have chest pain, abdominal pain, shortness of breath, or sweating.  Your vision changes.  You notice any bleeding.  You have a headache.  You have neck pain or a stiff neck.  You have a fever.   This information is not intended to replace advice given to you by your health care provider. Make sure you discuss any questions you have with your health care  provider.   Document Released: 01/18/2001 Document Revised: 12/09/2014 Document Reviewed: 07/21/2014 Elsevier Interactive Patient Education 2016 [employer]  Benign Positional Vertigo Vertigo is the feeling that you or your surroundings are moving when they are not. Benign positional vertigo is the most common form of vertigo. The cause of this condition is not serious (is benign). This condition is triggered by certain movements and positions (is positional). This  condition can be dangerous if it occurs while you are doing something that could endanger you or others, such as driving.  CAUSES In many cases, the cause of this condition is not known. It may be caused by a disturbance in an area of the inner ear that helps your brain to sense movement and balance. This disturbance can be caused by a viral infection (labyrinthitis), head injury, or repetitive motion. RISK FACTORS This condition is more likely to develop in:  Women.  People who are 40 years of age or older. SYMPTOMS Symptoms of this condition usually happen when you move your head or your eyes in different directions. Symptoms may start suddenly, and they usually last for less than a minute. Symptoms may include:  Loss of balance and falling.  Feeling like you are spinning or moving.  Feeling like your surroundings are spinning or moving.  Nausea and vomiting.  Blurred vision.  Dizziness.  Involuntary eye movement (nystagmus). Symptoms can be mild and cause only slight annoyance, or they can be severe and interfere with daily life. Episodes of benign positional vertigo may return (recur) over time, and they may be triggered by certain movements. Symptoms may improve over time. DIAGNOSIS This condition is usually diagnosed by medical history and a physical exam of the head, neck, and ears. You may be referred to a health care provider who specializes in ear, nose, and throat (ENT) problems (otolaryngologist) or a provider who specializes in disorders of the nervous system (neurologist). You may have additional testing, including:  MRI.  A CT scan.  Eye movement tests. Your health care provider may ask you to change positions quickly while he or she watches you for symptoms of benign positional vertigo, such as nystagmus. Eye movement may be tested with an electronystagmogram (ENG), caloric stimulation, the Dix-Hallpike test, or the roll test.  An electroencephalogram (EEG). This  records electrical activity in your brain.  Hearing tests. TREATMENT Usually, your health care provider will treat this by moving your head in specific positions to adjust your inner ear back to normal. Surgery may be needed in severe cases, but this is rare. In some cases, benign positional vertigo may resolve on its own in 2-4 weeks. HOME CARE INSTRUCTIONS Safety  Move slowly.Avoid sudden body or head movements.  Avoid driving.  Avoid operating heavy machinery.  Avoid doing any tasks that would be dangerous to you or others if a vertigo episode would occur.  If you have trouble walking or keeping your balance, try using a cane for stability. If you feel dizzy or unstable, sit down right away.  Return to your normal activities as told by your health care provider. Ask your health care provider what activities are safe for you. General Instructions  Take over-the-counter and prescription medicines only as told by your health care provider.  Avoid certain positions or movements as told by your health care provider.  Drink enough fluid to keep your urine clear or pale yellow.  Keep all follow-up visits as told by your health care provider. This is important. SEEK MEDICAL CARE  IF:  You have a fever.  Your condition gets worse or you develop new symptoms.  Your family or friends notice any behavioral changes.  Your nausea or vomiting gets worse.  You have numbness or a "pins and needles" sensation. SEEK IMMEDIATE MEDICAL CARE IF:  You have difficulty speaking or moving.  You are always dizzy.  You faint.  You develop severe headaches.  You have weakness in your legs or arms.  You have changes in your hearing or vision.  You develop a stiff neck.  You develop sensitivity to light.   This information is not intended to replace advice given to you by your health care provider. Make sure you discuss any questions you have with your health care provider.   Document  Released: 05/02/2006 Document Revised: 04/15/2015 Document Reviewed: 11/17/2014 Elsevier Interactive Patient Education 2016 [employer]  Urinary Tract Infection A urinary tract infection (UTI) can occur any place along the urinary tract. The tract includes the kidneys, ureters, bladder, and urethra. A type of germ called bacteria often causes a UTI. UTIs are often helped with antibiotic medicine.  HOME CARE   If given, take antibiotics as told by your doctor. Finish them even if you start to feel better.  Drink enough fluids to keep your pee (urine) clear or pale yellow.  Avoid tea, drinks with caffeine, and bubbly (carbonated) drinks.  Pee often. Avoid holding your pee in for a long time.  Pee before and after having sex (intercourse).  Wipe from front to back after you poop (bowel movement) if you are a woman. Use each tissue only once. GET HELP RIGHT AWAY IF:   You have back pain.  You have lower belly (abdominal) pain.  You have chills.  You feel sick to your stomach (nauseous).  You throw up (vomit).  Your burning or discomfort with peeing does not go away.  You have a fever.  Your symptoms are not better in 3 days. MAKE SURE YOU:   Understand these instructions.  Will watch your condition.  Will get help right away if you are not doing well or get worse.   This information is not intended to replace advice given to you by your health care provider. Make sure you discuss any questions you have with your health care provider.   Document Released: 01/11/2008 Document Revised: 08/15/2014 Document Reviewed: 02/23/2012 Elsevier Interactive Patient Education 2016 [employer]  Abdominal Pain, Adult Many things can cause belly (abdominal) pain. Most times, the belly pain is not dangerous. Many cases of belly pain can be watched and treated at home. HOME CARE   Do not take medicines that help you go poop (laxatives) unless told to by your doctor.  Only take  medicine as told by your doctor.  Eat or drink as told by your doctor. Your doctor will tell you if you should be on a special diet. GET HELP IF:  You do not know what is causing your belly pain.  You have belly pain while you are sick to your stomach (nauseous) or have runny poop (diarrhea).  You have pain while you pee or poop.  Your belly pain wakes you up at night.  You have belly pain that gets worse or better when you eat.  You have belly pain that gets worse when you eat fatty foods.  You have a fever. GET HELP RIGHT AWAY IF:   The pain does not go away within 2 hours.  You keep throwing up (vomiting).  The pain changes and is only in the right or left part of the belly.  You have bloody or tarry looking poop. MAKE SURE YOU:   Understand these instructions.  Will watch your condition.  Will get help right away if you are not doing well or get worse.   This information is not intended to replace advice given to you by your health care provider. Make sure you discuss any questions you have with your health care provider.   Document Released: 01/11/2008 Document Revised: 08/15/2014 Document Reviewed: 04/03/2013 Elsevier Interactive Patient Education [employer]

## 2015-11-26 NOTE — ED Notes (Signed)
Patient taken sandwich tray and (2) Grape drinks.

## 2015-12-16 ENCOUNTER — Emergency Department: Payer: Managed Care, Other (non HMO)

## 2015-12-16 ENCOUNTER — Emergency Department
Admission: EM | Admit: 2015-12-16 | Discharge: 2015-12-16 | Disposition: A | Discharge status: Home / Self Care | Payer: Managed Care, Other (non HMO) | Attending: Emergency Medicine | Admitting: Emergency Medicine

## 2015-12-16 ENCOUNTER — Encounter: Payer: Self-pay | Admitting: Emergency Medicine

## 2015-12-16 DIAGNOSIS — Z79899 Other long term (current) drug therapy: Secondary | ICD-10-CM | POA: Insufficient documentation

## 2015-12-16 DIAGNOSIS — Z792 Long term (current) use of antibiotics: Secondary | ICD-10-CM | POA: Insufficient documentation

## 2015-12-16 DIAGNOSIS — R079 Chest pain, unspecified: Secondary | ICD-10-CM | POA: Insufficient documentation

## 2015-12-16 LAB — CBC
HCT: 35.6 % (ref 35.0–47.0)
Hemoglobin: 11.9 g/dL — ABNORMAL LOW (ref 12.0–16.0)
MCH: 29.2 pg (ref 26.0–34.0)
MCHC: 33.5 g/dL (ref 32.0–36.0)
MCV: 87.2 fL (ref 80.0–100.0)
PLATELETS: 299 10*3/uL (ref 150–440)
RBC: 4.08 MIL/uL (ref 3.80–5.20)
RDW: 14.8 % — ABNORMAL HIGH (ref 11.5–14.5)
WBC: 12.8 10*3/uL — ABNORMAL HIGH (ref 3.6–11.0)

## 2015-12-16 LAB — BASIC METABOLIC PANEL
Anion gap: 9 (ref 5–15)
BUN: 14 mg/dL (ref 6–20)
CHLORIDE: 103 mmol/L (ref 101–111)
CO2: 24 mmol/L (ref 22–32)
CREATININE: 0.79 mg/dL (ref 0.44–1.00)
Calcium: 9 mg/dL (ref 8.9–10.3)
GFR calc non Af Amer: >60 mL/min (ref >60)
Glucose, Bld: 100 mg/dL — ABNORMAL HIGH (ref 65–99)
Potassium: 3.7 mmol/L (ref 3.5–5.1)
Sodium: 136 mmol/L (ref 135–145)

## 2015-12-16 LAB — FIBRIN DERIVATIVES D-DIMER (ARMC ONLY): Fibrin derivatives D-dimer (ARMC): 244 (ref 0–499)

## 2015-12-16 LAB — TROPONIN I: Troponin I: <0.03 ng/mL (ref <0.031)

## 2015-12-16 NOTE — ED Provider Notes (Signed)
[facility] Emergency Department Provider Note  ____________________________________________  Time seen: 1:45 AM  I have reviewed the triage vital signs and the nursing notes.   HISTORY  Chief Complaint Chest Pain     HPI Kelli Rocha is a 25 y.o. female presents with left-sided chest pain radiating to left arm accompanied by nausea with acute onset this morning. Patient denies any dyspnea. Patient does however admit to intermittent lower extremity pain denies any swelling. Patient is to family history of DVT her mother.     Past Medical History  Diagnosis Date  . Hx of chlamydia infection     Patient Active Problem List   Diagnosis Date Noted  . Normal delivery 04/19/2012    Past Surgical History  Procedure Laterality Date  . Myringotomy      Current Outpatient Rx  Name  Route  Sig  Dispense  Refill  . butalbital-acetaminophen-caffeine (FIORICET) 50-325-40 MG per tablet   Oral   Take 1-2 tablets by mouth every 6 (six) hours as needed for headache.   12 tablet   0   . ciprofloxacin (CIPRO) 500 MG tablet   Oral   Take 1 tablet (500 mg total) by mouth 2 (two) times daily.   6 tablet   0   . clotrimazole (LOTRIMIN) 1 % cream   Topical   Apply 1 application topically 2 (two) times daily.   30 g   0   . EXPIRED: ferrous sulfate 325 (65 FE) MG tablet   Oral   Take 1 tablet (325 mg total) by mouth 2 (two) times daily with a meal. Patient not taking: Reported on 10/24/2014   50 tablet   5   . meclizine (ANTIVERT) 25 MG tablet   Oral   Take 1 tablet (25 mg total) by mouth 3 (three) times daily as needed for dizziness or nausea.   20 tablet   0   . EXPIRED: norethindrone (ORTHO MICRONOR) 0.35 MG tablet   Oral   Take 1 tablet (0.35 mg total) by mouth daily. 2nd Sunday start Patient not taking: Reported on 10/24/2014   28 tablet   11   . ondansetron (ZOFRAN ODT) 4 MG disintegrating tablet   Oral   Take 1 tablet (4 mg total) by  mouth every 8 (eight) hours as needed for nausea or vomiting.   20 tablet   0   . Prenatal Vit-Fe Fumarate-FA (PRENATAL MULTIVITAMIN) TABS   Oral   Take 1 tablet by mouth daily. Patient not taking: Reported on 10/24/2014   60 tablet   5   . sulfamethoxazole-trimethoprim (BACTRIM DS,SEPTRA DS) 800-160 MG tablet   Oral   Take 1 tablet by mouth 2 (two) times daily.   10 tablet   0     Allergies Tramadol  Family History  Problem Relation Age of Onset  . Arthritis Mother   . Depression Mother   . Diabetes Mother   . Hearing loss Mother   . Hypertension Mother   . Kidney disease Mother   . Hypertension Father     Social History Social History  Substance Use Topics  . Smoking status: Never Smoker   . Smokeless tobacco: Never Used  . Alcohol Use: Yes     Comment: occ    Review of Systems  Constitutional: Negative for fever. Eyes: Negative for visual changes. ENT: Negative for sore throat. Cardiovascular: Positive for chest pain. Respiratory: Negative for shortness of breath. Gastrointestinal: Negative for abdominal pain, vomiting and  diarrhea. Genitourinary: Negative for dysuria. Musculoskeletal: Negative for back pain. Skin: Negative for rash. Neurological: Negative for headaches, focal weakness or numbness.   10-point ROS otherwise negative.  ____________________________________________   PHYSICAL EXAM:  VITAL SIGNS: ED Triage Vitals  Enc Vitals Group     BP 12/16/15 0054 148/93 mmHg     Pulse Rate 12/16/15 0054 89     Resp 12/16/15 0054 18     Temp 12/16/15 0054 98.1 F (36.7 C)     Temp Source 12/16/15 0054 Oral     SpO2 12/16/15 0054 100 %     Weight 12/16/15 0134 286 lb (129.729 kg)     Height 12/16/15 0134 5\' 6"  (1.676 m)     Head Cir --      Peak Flow --      Pain Score 12/16/15 0051 10     Pain Loc --      Pain Edu? --      Excl. in GC? --     Constitutional: Alert and oriented. Well appearing and in no distress. Eyes: Conjunctivae  are normal. PERRL. Normal extraocular movements. ENT   Head: Normocephalic and atraumatic.   Nose: No congestion/rhinnorhea.   Mouth/Throat: Mucous membranes are moist.   Neck: No stridor. Hematological/Lymphatic/Immunilogical: No cervical lymphadenopathy. Cardiovascular: Normal rate, regular rhythm. Normal and symmetric distal pulses are present in all extremities. No murmurs, rubs, or gallops. Respiratory: Normal respiratory effort without tachypnea nor retractions. Breath sounds are clear and equal bilaterally. No wheezes/rales/rhonchi. Gastrointestinal: Soft and nontender. No distention. There is no CVA tenderness. Genitourinary: deferred Musculoskeletal: Nontender with normal range of motion in all extremities. No joint effusions.  No lower extremity tenderness nor edema. Neurologic:  Normal speech and language. No gross focal neurologic deficits are appreciated. Speech is normal.  Skin:  Skin is warm, dry and intact. No rash noted. Psychiatric: Mood and affect are normal. Speech and behavior are normal. Patient exhibits appropriate insight and judgment.  ____________________________________________    LABS (pertinent positives/negatives)  Labs Reviewed  BASIC METABOLIC PANEL - Abnormal; Notable for the following:    Glucose, Bld 100 (*)    All other components within normal limits  CBC - Abnormal; Notable for the following:    WBC 12.8 (*)    Hemoglobin 11.9 (*)    RDW 14.8 (*)    All other components within normal limits  FIBRIN DERIVATIVES D-DIMER ([facility] ONLY)  TROPONIN I     ____________________________________________   EKG  ED ECG REPORT I, [place] N Breken Nazari, the attending physician, personally viewed and interpreted this ECG.   Date: 12/16/2015  EKG Time: 12:56 AM  Rate: 85  Rhythm: Normal sinus rhythm  Axis: normal  Intervals: Normal  ST&T Change: None __    RADIOLOGY   DG Chest Port 1 View (Final result) Result time: 12/16/15 02:21:53    Final result by Rad Results In Interface (12/16/15 02:21:53)   Narrative:   CLINICAL DATA: Sudden onset left-sided chest pain radiating into the left upper extremity. Onset at 00:00.  EXAM: PORTABLE CHEST 1 VIEW  COMPARISON: 10/24/2014  FINDINGS: A single AP portable view of the chest demonstrates no focal airspace consolidation or alveolar edema. The lungs are grossly clear. There is no large effusion or pneumothorax. Cardiac and mediastinal contours appear unremarkable.  IMPRESSION: No active disease.   Electronically Signed By: Ellery Plunkaniel R Mitchell M.D. On: 12/16/2015 02:21          INITIAL IMPRESSION / ASSESSMENT AND PLAN / ED COURSE  Pertinent labs & imaging results that were available during my care of the patient were reviewed by me and considered in my medical decision making (see chart for details).  Cardiac enzymes negative EKG revealed no evidence of ST segment elevation d-dimer negative low probability patient.  ____________________________________________   FINAL CLINICAL IMPRESSION(S) / ED DIAGNOSES  Final diagnoses:  Chest pain, unspecified chest pain type      Darci Current, MD 12/16/15 [phone]

## 2015-12-16 NOTE — ED Notes (Signed)
Pt to STAT registration, tearful; st sudden onset left sided CP radiating into left arm accomp by nausea; denies hx of same

## 2015-12-16 NOTE — Discharge Instructions (Signed)
Nonspecific Chest Pain  °Chest pain can be caused by many different conditions. There is always a chance that your pain could be related to something serious, such as a heart attack or a blood clot in your lungs. Chest pain can also be caused by conditions that are not life-threatening. If you have chest pain, it is very important to follow up with your health care provider. °CAUSES  °Chest pain can be caused by: °· Heartburn. °· Pneumonia or bronchitis. °· Anxiety or stress. °· Inflammation around your heart (pericarditis) or lung (pleuritis or pleurisy). °· A blood clot in your lung. °· A collapsed lung (pneumothorax). It can develop suddenly on its own (spontaneous pneumothorax) or from trauma to the chest. °· Shingles infection (varicella-zoster virus). °· Heart attack. °· Damage to the bones, muscles, and cartilage that make up your chest wall. This can include: °¨ Bruised bones due to injury. °¨ Strained muscles or cartilage due to frequent or repeated coughing or overwork. °¨ Fracture to one or more ribs. °¨ Sore cartilage due to inflammation (costochondritis). °RISK FACTORS  °Risk factors for chest pain may include: °· Activities that increase your risk for trauma or injury to your chest. °· Respiratory infections or conditions that cause frequent coughing. °· Medical conditions or overeating that can cause heartburn. °· Heart disease or family history of heart disease. °· Conditions or health behaviors that increase your risk of developing a blood clot. °· Having had chicken pox (varicella zoster). °SIGNS AND SYMPTOMS °Chest pain can feel like: °· Burning or tingling on the surface of your chest or deep in your chest. °· Crushing, pressure, aching, or squeezing pain. °· Dull or sharp pain that is worse when you move, cough, or take a deep breath. °· Pain that is also felt in your back, neck, shoulder, or arm, or pain that spreads to any of these areas. °Your chest pain may come and go, or it may stay  constant. °DIAGNOSIS °Lab tests or other studies may be needed to find the cause of your pain. Your health care provider may have you take a test called an ambulatory ECG (electrocardiogram). An ECG records your heartbeat patterns at the time the test is performed. You may also have other tests, such as: °· Transthoracic echocardiogram (TTE). During echocardiography, sound waves are used to create a picture of all of the heart structures and to look at how blood flows through your heart. °· Transesophageal echocardiogram (TEE). This is a more advanced imaging test that obtains images from inside your body. It allows your health care provider to see your heart in finer detail. °· Cardiac monitoring. This allows your health care provider to monitor your heart rate and rhythm in real time. °· Holter monitor. This is a portable device that records your heartbeat and can help to diagnose abnormal heartbeats. It allows your health care provider to track your heart activity for several days, if needed. °· Stress tests. These can be done through exercise or by taking medicine that makes your heart beat more quickly. °· Blood tests. °· Imaging tests. °TREATMENT  °Your treatment depends on what is causing your chest pain. Treatment may include: °· Medicines. These may include: °¨ Acid blockers for heartburn. °¨ Anti-inflammatory medicine. °¨ Pain medicine for inflammatory conditions. °¨ Antibiotic medicine, if an infection is present. °¨ Medicines to dissolve blood clots. °¨ Medicines to treat coronary artery disease. °· Supportive care for conditions that do not require medicines. This may include: °¨ Resting. °¨ Applying heat   or cold packs to injured areas. °¨ Limiting activities until pain decreases. °HOME CARE INSTRUCTIONS °· If you were prescribed an antibiotic medicine, finish it all even if you start to feel better. °· Avoid any activities that bring on chest pain. °· Do not use any tobacco products, including  cigarettes, chewing tobacco, or electronic cigarettes. If you need help quitting, ask your health care provider. °· Do not drink alcohol. °· Take medicines only as directed by your health care provider. °· Keep all follow-up visits as directed by your health care provider. This is important. This includes any further testing if your chest pain does not go away. °· If heartburn is the cause for your chest pain, you may be told to keep your head raised (elevated) while sleeping. This reduces the chance that acid will go from your stomach into your esophagus. °· Make lifestyle changes as directed by your health care provider. These may include: °¨ Getting regular exercise. Ask your health care provider to suggest some activities that are safe for you. °¨ Eating a heart-healthy diet. A registered dietitian can help you to learn healthy eating options. °¨ Maintaining a healthy weight. °¨ Managing diabetes, if necessary. °¨ Reducing stress. °SEEK MEDICAL CARE IF: °· Your chest pain does not go away after treatment. °· You have a rash with blisters on your chest. °· You have a fever. °SEEK IMMEDIATE MEDICAL CARE IF:  °· Your chest pain is worse. °· You have an increasing cough, or you cough up blood. °· You have severe abdominal pain. °· You have severe weakness. °· You faint. °· You have chills. °· You have sudden, unexplained chest discomfort. °· You have sudden, unexplained discomfort in your arms, back, neck, or jaw. °· You have shortness of breath at any time. °· You suddenly start to sweat, or your skin gets clammy. °· You feel nauseous or you vomit. °· You suddenly feel light-headed or dizzy. °· Your heart begins to beat quickly, or it feels like it is skipping beats. °These symptoms may represent a serious problem that is an emergency. Do not wait to see if the symptoms will go away. Get medical help right away. Call your local emergency services (911 in the [place] Do not drive yourself to the hospital. °  °This  information is not intended to replace advice given to you by your health care provider. Make sure you discuss any questions you have with your health care provider. °  °Document Released: 05/04/2005 Document Revised: 08/15/2014 Document Reviewed: 02/28/2014 °Elsevier Interactive Patient Education ©2016 [employer] ° °

## 2017-01-29 ENCOUNTER — Encounter: Payer: Self-pay | Admitting: Emergency Medicine

## 2017-01-29 ENCOUNTER — Emergency Department
Admission: EM | Admit: 2017-01-29 | Discharge: 2017-01-29 | Disposition: A | Discharge status: Home / Self Care | Payer: Managed Care, Other (non HMO) | Attending: Student in an Organized Health Care Education/Training Program | Admitting: Student in an Organized Health Care Education/Training Program

## 2017-01-29 DIAGNOSIS — Y92410 Unspecified street and highway as the place of occurrence of the external cause: Secondary | ICD-10-CM | POA: Insufficient documentation

## 2017-01-29 DIAGNOSIS — Y9389 Activity, other specified: Secondary | ICD-10-CM | POA: Insufficient documentation

## 2017-01-29 DIAGNOSIS — Y998 Other external cause status: Secondary | ICD-10-CM | POA: Insufficient documentation

## 2017-01-29 DIAGNOSIS — M545 Low back pain: Secondary | ICD-10-CM | POA: Diagnosis present

## 2017-01-29 LAB — URINALYSIS, COMPLETE (UACMP) WITH MICROSCOPIC
BACTERIA UA: NONE SEEN
BILIRUBIN URINE: NEGATIVE
GLUCOSE, UA: NEGATIVE mg/dL
Hgb urine dipstick: NEGATIVE
KETONES UR: NEGATIVE mg/dL
LEUKOCYTES UA: NEGATIVE
Nitrite: NEGATIVE
PH: 6 (ref 5.0–8.0)
PROTEIN: NEGATIVE mg/dL
Specific Gravity, Urine: 1.031 — ABNORMAL HIGH (ref 1.005–1.030)

## 2017-01-29 LAB — POCT PREGNANCY, URINE: Preg Test, Ur: NEGATIVE

## 2017-01-29 MED ORDER — NAPROXEN 500 MG PO TABS: 500.0000 mg | ORAL_TABLET | Freq: Two times a day (BID) | ORAL | 0 refills | Status: AC

## 2017-01-29 MED ORDER — CYCLOBENZAPRINE HCL 5 MG PO TABS: 5.0000 mg | ORAL_TABLET | Freq: Three times a day (TID) | ORAL | 0 refills | Status: DC | PRN
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
## 2017-01-29 NOTE — ED Notes (Signed)
This nurse and provider Jenise Bacon walked into room. Pt not in room for examination  
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
## 2017-07-15 ENCOUNTER — Encounter: Payer: Self-pay | Admitting: Emergency Medicine

## 2017-07-15 ENCOUNTER — Emergency Department
Admission: EM | Admit: 2017-07-15 | Discharge: 2017-07-16 | Disposition: A | Discharge status: Home / Self Care | Payer: Managed Care, Other (non HMO) | Attending: Emergency Medicine | Admitting: Emergency Medicine

## 2017-07-15 ENCOUNTER — Emergency Department: Payer: Managed Care, Other (non HMO)

## 2017-07-15 ENCOUNTER — Other Ambulatory Visit: Payer: Self-pay

## 2017-07-15 DIAGNOSIS — R079 Chest pain, unspecified: Secondary | ICD-10-CM | POA: Insufficient documentation

## 2017-07-15 LAB — BASIC METABOLIC PANEL
ANION GAP: 9 (ref 5–15)
BUN: 14 mg/dL (ref 6–20)
CALCIUM: 8.6 mg/dL — AB (ref 8.9–10.3)
CHLORIDE: 104 mmol/L (ref 101–111)
CO2: 25 mmol/L (ref 22–32)
Creatinine, Ser: 0.68 mg/dL (ref 0.44–1.00)
GFR calc non Af Amer: >60 mL/min (ref >60)
GLUCOSE: 96 mg/dL (ref 65–99)
POTASSIUM: 3.6 mmol/L (ref 3.5–5.1)
Sodium: 138 mmol/L (ref 135–145)

## 2017-07-15 LAB — CBC
HEMATOCRIT: 36.1 % (ref 35.0–47.0)
Hemoglobin: 11.9 g/dL — ABNORMAL LOW (ref 12.0–16.0)
MCH: 29.4 pg (ref 26.0–34.0)
MCHC: 33.1 g/dL (ref 32.0–36.0)
MCV: 88.9 fL (ref 80.0–100.0)
Platelets: 256 10*3/uL (ref 150–440)
RBC: 4.06 MIL/uL (ref 3.80–5.20)
RDW: 14 % (ref 11.5–14.5)
WBC: 11.9 10*3/uL — ABNORMAL HIGH (ref 3.6–11.0)

## 2017-07-15 LAB — TROPONIN I

## 2017-07-15 MED ORDER — KETOROLAC TROMETHAMINE 30 MG/ML IJ SOLN
30.0000 mg | Freq: Once | INTRAMUSCULAR | Status: AC
  Administered 2017-07-15: 30 mg via INTRAVENOUS
  Filled 2017-07-15: qty 1

## 2017-07-15 NOTE — ED Notes (Signed)
Patient transported to XR. 

## 2017-07-15 NOTE — ED Provider Notes (Signed)
[facility] Emergency Department Provider Note  ____________________________________________   First MD Initiated Contact with Patient 07/15/17 2157     (approximate)  I have reviewed the triage vital signs and the nursing notes.   HISTORY  Chief Complaint Chest Pain   HPI Kelli Rocha is a 26 y.o. female with a history of anxiety as well as recurrent chest pain who is presenting with right-sided chest pain that started at 4 PM this afternoon.  The patient states the pain is a 6-7 out of 10 right now and aching.  It is nonradiating.  However, it is associated with shortness of breath.  Exacerbated with movement.  No apparent injury or heavy lifting recently.  Patient denies any diaphoresis, nausea or vomiting.  Denies any supplemental hormones such as estrogen and does not think that she could be pregnant.  Says that she was feeling anxious earlier when the chest pain started but the anxiety has gone away but the chest pain persisted.  Patient says that she has been seen twice in the past year for similar chest pain.  Denies any sudden deaths at young ages in her family or people in their 30s or 30s with heart disease.   Past Medical History:  Diagnosis Date  . Hx of chlamydia infection     Patient Active Problem List   Diagnosis Date Noted  . Normal delivery 04/19/2012    Past Surgical History:  Procedure Laterality Date  . MYRINGOTOMY      Prior to Admission medications   Medication Sig Start Date End Date Taking? Authorizing Provider  ciprofloxacin (CIPRO) 500 MG tablet Take 1 tablet (500 mg total) by mouth 2 (two) times daily. Patient not taking: Reported on 07/15/2017 11/26/15   Irean Hong, MD  clotrimazole (LOTRIMIN) 1 % cream Apply 1 application topically 2 (two) times daily. Patient not taking: Reported on 07/15/2017 08/26/15   Charlynne Pander, MD  cyclobenzaprine (FLEXERIL) 5 MG tablet Take 1 tablet (5 mg total) by mouth 3 (three) times  daily as needed for muscle spasms. 01/29/17   Menshew, Charlesetta Ivory, PA-C  ferrous sulfate 325 (65 FE) MG tablet Take 1 tablet (325 mg total) by mouth 2 (two) times daily with a meal. Patient not taking: Reported on 10/24/2014 04/19/12 04/19/13  Antionette Char, MD  meclizine (ANTIVERT) 25 MG tablet Take 1 tablet (25 mg total) by mouth 3 (three) times daily as needed for dizziness or nausea. 11/26/15   Irean Hong, MD  norethindrone (ORTHO MICRONOR) 0.35 MG tablet Take 1 tablet (0.35 mg total) by mouth daily. 2nd Sunday start Patient not taking: Reported on 10/24/2014 04/19/12 04/19/13  Antionette Char, MD  ondansetron (ZOFRAN ODT) 4 MG disintegrating tablet Take 1 tablet (4 mg total) by mouth every 8 (eight) hours as needed for nausea or vomiting. 11/26/15   Irean Hong, MD  Prenatal Vit-Fe Fumarate-FA (PRENATAL MULTIVITAMIN) TABS Take 1 tablet by mouth daily. Patient not taking: Reported on 10/24/2014 04/19/12   Antionette Char, MD  sulfamethoxazole-trimethoprim (BACTRIM DS,SEPTRA DS) 800-160 MG tablet Take 1 tablet by mouth 2 (two) times daily. Patient not taking: Reported on 07/15/2017 08/26/15   Charlynne Pander, MD    Allergies Tramadol  Family History  Problem Relation Age of Onset  . Arthritis Mother   . Depression Mother   . Diabetes Mother   . Hearing loss Mother   . Hypertension Mother   . Kidney disease Mother   . Hypertension Father  Social History Social History   Tobacco Use  . Smoking status: Never Smoker  . Smokeless tobacco: Never Used  Substance Use Topics  . Alcohol use: No    Frequency: Never    Comment: occ  . Drug use: No    Review of Systems  Constitutional: No fever/chills Eyes: No visual changes. ENT: No sore throat. Cardiovascular: As above Respiratory: As above  gastrointestinal: No abdominal pain.  No nausea, no vomiting.  No diarrhea.  No constipation. Genitourinary: Negative for dysuria. Musculoskeletal: Negative for back  pain. Skin: Negative for rash. Neurological: Negative for headaches, focal weakness or numbness.   ____________________________________________   PHYSICAL EXAM:  VITAL SIGNS: ED Triage Vitals  Enc Vitals Group     BP 07/15/17 2201 131/82     Pulse Rate 07/15/17 2201 78     Resp 07/15/17 2201 16     Temp 07/15/17 2201 98.1 F (36.7 C)     Temp Source 07/15/17 2201 Oral     SpO2 07/15/17 2201 100 %     Weight 07/15/17 2157 270 lb (122.5 kg)     Height 07/15/17 2157 5\' 5"  (1.651 m)     Head Circumference --      Peak Flow --      Pain Score 07/15/17 2157 8     Pain Loc --      Pain Edu? --      Excl. in GC? --     Constitutional: Alert and oriented. Well appearing and in no acute distress. Eyes: Conjunctivae are normal.  Head: Atraumatic. Nose: No congestion/rhinnorhea. Mouth/Throat: Mucous membranes are moist.  Neck: No stridor.   Cardiovascular: Normal rate, regular rhythm. Grossly normal heart sounds.   Respiratory: Normal respiratory effort.  No retractions. Lungs CTAB. Gastrointestinal: Soft and nontender. No distention. Musculoskeletal: No lower extremity tenderness nor edema.  No joint effusions. Neurologic:  Normal speech and language. No gross focal neurologic deficits are appreciated. Skin:  Skin is warm, dry and intact. No rash noted. Psychiatric: Mood and affect are normal. Speech and behavior are normal.  ____________________________________________   LABS (all labs ordered are listed, but only abnormal results are displayed)  Labs Reviewed  BASIC METABOLIC PANEL - Abnormal; Notable for the following components:      Result Value   Calcium 8.6 (*)    All other components within normal limits  CBC - Abnormal; Notable for the following components:   WBC 11.9 (*)    Hemoglobin 11.9 (*)    All other components within normal limits  TROPONIN I  POC URINE PREG, ED   ____________________________________________  EKG  ED ECG REPORT I, Arelia Longestavid M  Chayton Murata, the attending physician, personally viewed and interpreted this ECG.   Date: 07/15/2017  EKG Time: 2200  Rate: 74  Rhythm: Normal sinus rhythm  Axis: Normal  Intervals:none  ST&T Change: No ST segment elevation or depression.  No abnormal T wave inversion.  ____________________________________________  RADIOLOGY  No acute disease process ____________________________________________   PROCEDURES  Procedure(s) performed:   Procedures  Critical Care performed:   ____________________________________________   INITIAL IMPRESSION / ASSESSMENT AND PLAN / ED COURSE  Pertinent labs & imaging results that were available during my care of the patient were reviewed by me and considered in my medical decision making (see chart for details).  Differential diagnosis includes, but is not limited to, ACS, aortic dissection, pulmonary embolism, cardiac tamponade, pneumothorax, pneumonia, pericarditis, myocarditis, GI-related causes including esophagitis/gastritis, and musculoskeletal chest wall pain.  As part of my medical decision making, I reviewed the following data within the electronic MEDICAL RECORD NUMBER Notes from prior ED visits  PERC negative.     ----------------------------------------- 11:22 PM on 07/15/2017 -----------------------------------------  Patient is saying that the pain is "eased off."  However, she says that she still feels some soreness to the right side of the chest.  Very reassuring workup including negative troponin as well as a reassuring EKG which was read as normal and I agree with this read.  Patient also with worsening pain upon movement.  PE RC ruled negative.  Likely chest wall pain.  Patient will be following up with her primary care doctor.  Recommended trying topical salves such as Aspercreme or icy hot as well as p.o. NSAIDs.  The patient is understanding of this plan willing to comply.  Will be discharged home at this  time.  ____________________________________________   FINAL CLINICAL IMPRESSION(S) / ED DIAGNOSES  Chest pain.    NEW MEDICATIONS STARTED DURING THIS VISIT:  This SmartLink is deprecated. Use AVSMEDLIST instead to display the medication list for a patient.   Note:  This document was prepared using Dragon voice recognition software and may include unintentional dictation errors.     Myrna BlazerSchaevitz, Katesha Eichel Matthew, MD 07/15/17 [phone]

## 2017-07-15 NOTE — ED Notes (Addendum)
Back in room s/p XR.

## 2017-07-15 NOTE — ED Triage Notes (Signed)
[facility] report right sided chest pain x 2 hours, described as tight, w/ SOB, reports some cough.

## 2017-07-15 NOTE — ED Notes (Signed)
Reviewed discharge instructions and follow-up care with patient. Patient verbalized understanding of all information reviewed. Patient stable, with no distress noted at this time.    

## 2019-01-29 ENCOUNTER — Other Ambulatory Visit: Payer: Self-pay

## 2019-01-29 ENCOUNTER — Encounter: Payer: Self-pay | Admitting: Emergency Medicine

## 2019-01-29 ENCOUNTER — Emergency Department
Admission: EM | Admit: 2019-01-29 | Discharge: 2019-01-29 | Disposition: A | Discharge status: Home / Self Care | Payer: BC Managed Care – PPO | Attending: Emergency Medicine | Admitting: Emergency Medicine

## 2019-01-29 ENCOUNTER — Emergency Department: Payer: BC Managed Care – PPO

## 2019-01-29 DIAGNOSIS — G44209 Tension-type headache, unspecified, not intractable: Secondary | ICD-10-CM | POA: Diagnosis not present

## 2019-01-29 DIAGNOSIS — R110 Nausea: Secondary | ICD-10-CM | POA: Diagnosis not present

## 2019-01-29 DIAGNOSIS — R51 Headache: Secondary | ICD-10-CM | POA: Diagnosis present

## 2019-01-29 LAB — POCT PREGNANCY, URINE: Preg Test, Ur: NEGATIVE

## 2019-01-29 MED ORDER — DIPHENHYDRAMINE HCL 50 MG/ML IJ SOLN
25.0000 mg | Freq: Once | INTRAMUSCULAR | Status: AC
  Administered 2019-01-29: 25 mg via INTRAVENOUS
  Filled 2019-01-29: qty 1

## 2019-01-29 MED ORDER — BUTALBITAL-APAP-CAFFEINE 50-325-40 MG PO TABS
2.0000 | ORAL_TABLET | Freq: Once | ORAL | Status: AC
  Administered 2019-01-29: 2 via ORAL
  Filled 2019-01-29: qty 2

## 2019-01-29 MED ORDER — SODIUM CHLORIDE 0.9 % IV BOLUS
1000.0000 mL | Freq: Once | INTRAVENOUS | Status: AC
  Administered 2019-01-29: 13:00:00 1000 mL via INTRAVENOUS

## 2019-01-29 MED ORDER — ONDANSETRON 4 MG PO TBDP
4.0000 mg | ORAL_TABLET | Freq: Once | ORAL | Status: AC
  Administered 2019-01-29: 11:00:00 4 mg via ORAL
  Filled 2019-01-29: qty 1

## 2019-01-29 MED ORDER — KETOROLAC TROMETHAMINE 30 MG/ML IJ SOLN
30.0000 mg | Freq: Once | INTRAMUSCULAR | Status: AC
  Administered 2019-01-29: 13:00:00 30 mg via INTRAVENOUS
  Filled 2019-01-29: qty 1

## 2019-01-29 MED ORDER — METOCLOPRAMIDE HCL 5 MG/ML IJ SOLN
10.0000 mg | Freq: Once | INTRAMUSCULAR | Status: AC
  Administered 2019-01-29: 10 mg via INTRAVENOUS
  Filled 2019-01-29: qty 2

## 2019-01-29 NOTE — ED Triage Notes (Signed)
Pt reports HA for the past week unrelieved by tylenol. Pt states gets headaches intermittently but has never been diagnosed with migraines.

## 2019-01-29 NOTE — ED Provider Notes (Signed)
[facility] Emergency Department Provider Note  ____________________________________________   First MD Initiated Contact with Patient 01/29/19 1036     (approximate)  I have reviewed the triage vital signs and the nursing notes.   HISTORY  Chief Complaint Headache   HPI Kelli Rocha is a 28 y.o. female presents to the ED with complaint of frontal headache and nausea.  Patient states that headaches have been intermittent for 1 week.  She has been taking over-the-counter acetaminophen without any relief.  Patient has a history of headaches but is never had a CT scan.  Patient denies any recent injury, visual changes.  She rates her pain as a 7 out of 10.         Past Medical History:  Diagnosis Date  . Hx of chlamydia infection     Patient Active Problem List   Diagnosis Date Noted  . Normal delivery 04/19/2012    Past Surgical History:  Procedure Laterality Date  . MYRINGOTOMY      Prior to Admission medications   Medication Sig Start Date End Date Taking? Authorizing Provider  levothyroxine (SYNTHROID) 100 MCG tablet Take 100 mcg by mouth daily before breakfast.   Yes [provider]    Allergies Tramadol  Family History  Problem Relation Age of Onset  . Arthritis Mother   . Depression Mother   . Diabetes Mother   . Hearing loss Mother   . Hypertension Mother   . Kidney disease Mother   . Hypertension Father     Social History Social History   Tobacco Use  . Smoking status: Never Smoker  . Smokeless tobacco: Never Used  Substance Use Topics  . Alcohol use: No    Frequency: Never    Comment: occ  . Drug use: No    Review of Systems Constitutional: No fever/chills Eyes: Positive photophobia. ENT: No sore throat. Cardiovascular: Denies chest pain. Respiratory: Denies shortness of breath. Gastrointestinal: No abdominal pain.  Positive nausea, no vomiting.  No diarrhea.  Genitourinary: Negative for dysuria.  Musculoskeletal: Negative for back pain. Skin: Negative for rash. Neurological: Positive for headaches, negative for focal weakness or numbness. ____________________________________________   PHYSICAL EXAM:  VITAL SIGNS: ED Triage Vitals [01/29/19 1023]  Enc Vitals Group     BP 129/90     Pulse Rate 90     Resp 20     Temp 98.2 F (36.8 C)     Temp Source Oral     SpO2 100 %     Weight (!) 304 lb 8 oz (138.1 kg)     Height 5\' 5"  (1.651 m)     Head Circumference      Peak Flow      Pain Score 7     Pain Loc      Pain Edu?      Excl. in [place]?     Constitutional: Alert and oriented. Well appearing and in no acute distress. Eyes: Conjunctivae are normal. PERRL. EOMI. Head: Atraumatic. Nose: No congestion/rhinnorhea. Mouth/Throat: Mucous membranes are moist.  Oropharynx non-erythematous. Neck: No stridor.  No point tenderness on palpation of cervical spine however there is paravertebral muscle tenderness bilaterally and into the trapezius muscle.  There is also tenderness on palpation at the base of the scalp which also is tender across the frontal scalp.  Neck is supple and range of motion is without restriction. Cardiovascular: Normal rate, regular rhythm. Grossly normal heart sounds.  Good peripheral circulation. Respiratory: Normal respiratory effort.  No retractions. Lungs CTAB. Musculoskeletal: Moves upper and lower extremities without any difficulty.  Normal gait was noted. Neurologic:  Normal speech and language. No gross focal neurologic deficits are appreciated.  Cranial nerves II through XII grossly intact.  No gait instability. Skin:  Skin is warm, dry and intact. No rash noted. Psychiatric: Mood and affect are normal. Speech and behavior are normal.  ____________________________________________   LABS (all labs ordered are listed, but only abnormal results are displayed)  Labs Reviewed  POC URINE PREG, ED  POCT PREGNANCY, URINE     RADIOLOGY CT head  radiology report from 2015 was reviewed.  Results were negative. ____________________________________________   PROCEDURES  Procedure(s) performed (including Critical Care):  Procedures   ____________________________________________   INITIAL IMPRESSION / ASSESSMENT AND PLAN / ED COURSE  As part of my medical decision making, I reviewed the following data within the electronic MEDICAL RECORD NUMBER Notes from prior ED visits and [place] Controlled Substance Database  28 year old female is to the ED with complaint of frontal headache that has been intermittent for approximately 1 week.  Patient states that she gets headaches but has not been diagnosed with migraines.  She also states she has not had a CT scan however one was reviewed from 2015 and reported as negative.  Patient has tried Tylenol over-the-counter without any relief.  Physical exam is consistent with a tension type headache.  CT scan today is negative and patient was reassured.  She was given normal saline, Toradol 30 mg IV, Reglan IV, and Benadryl IV.  Patient continued to complain of some discomfort and was given Fioricet 2 tablets p.o. she is to follow-up with her PCP or [facility] clinic acute care if any continued problems.   ____________________________________________   FINAL CLINICAL IMPRESSION(S) / ED DIAGNOSES  Final diagnoses:  Muscle contraction headache     ED Discharge Orders    None       Note:  This document was prepared using Dragon voice recognition software and may include unintentional dictation errors.    Tommi RumpsSummers, Nancyjo Givhan L, PA-C 01/29/19 1609    Jene EveryKinner, Robert, MD 01/29/19 [phone]

## 2019-01-29 NOTE — ED Notes (Signed)
See triage note  Presents with frontal headache with some nausea  No vomiting or fever  States headaches started about 1 week ago  Has tried OTC Tylenol w.o relief  States she has had intermittent headaches with vertigo but this is not the same  Placed in dark room

## 2019-01-29 NOTE — ED Notes (Signed)
UA Preg test = neg

## 2020-05-29 IMAGING — CT CT HEAD WITHOUT CONTRAST
3 series · 16 of 47 positions shown, 19 images · non-contrast
Comparison: 06/03/2014

CLINICAL DATA: Frontal headache with nausea for about a week,
intermittent vertigo

EXAM:
CT HEAD WITHOUT CONTRAST
TECHNIQUE: Contiguous axial images were obtained from the base of the skull
through the vertex without intravenous contrast. Sagittal and
coronal MPR images reconstructed from axial data set.

[Series 2: head wo · axial · 0.47mm/px · z∈[-168,-38]mm · 10 of 32 slices shown, 13 images]
[im 3/32  brain]
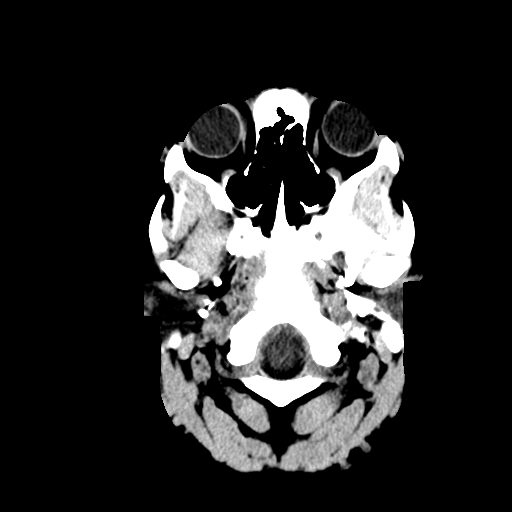
[im 3/32  bone]
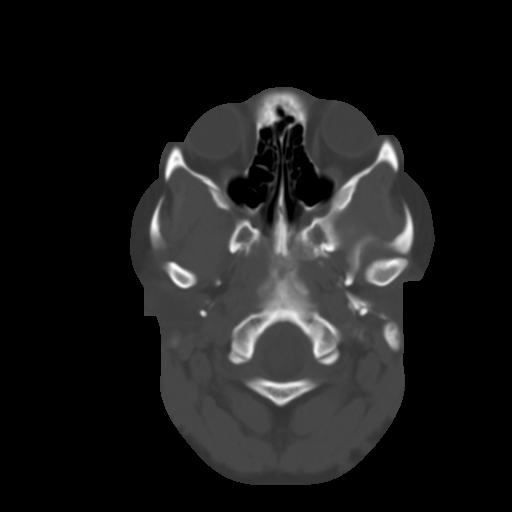
[im 6/32  brain]
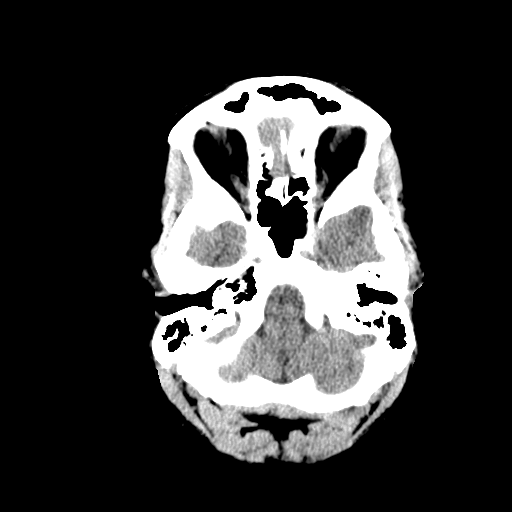
[im 9/32  brain]
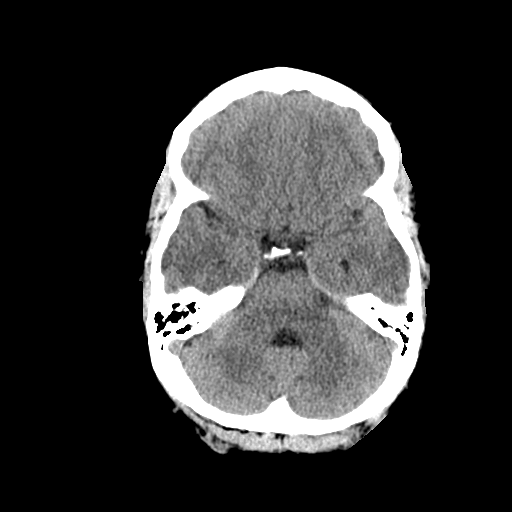
[im 11/32  brain]
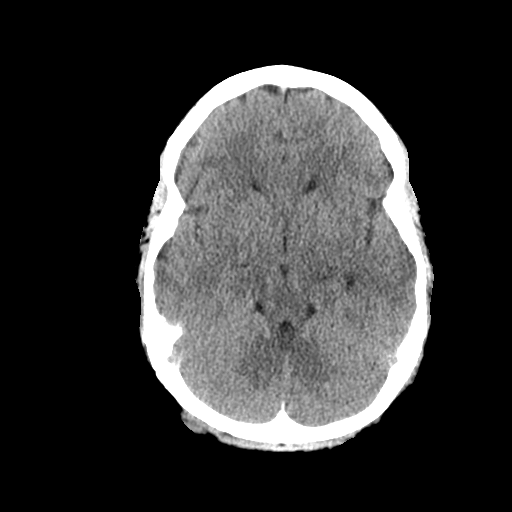
[im 14/32  brain]
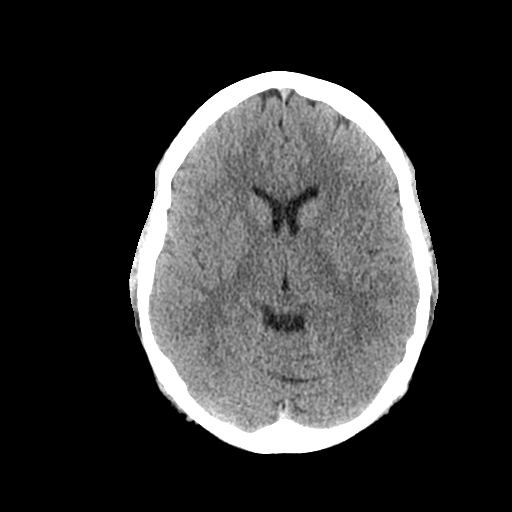
[im 14/32  bone]
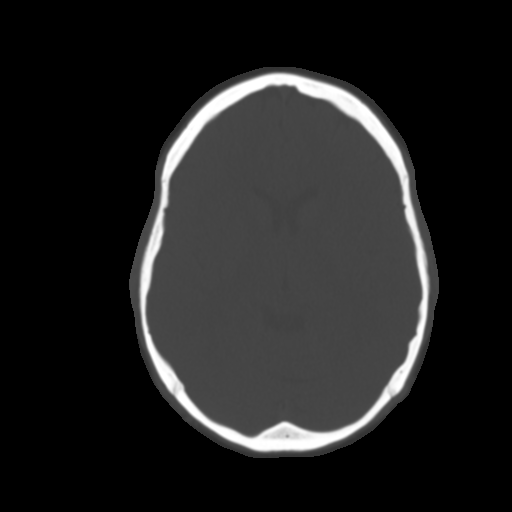
[im 18/32  brain]
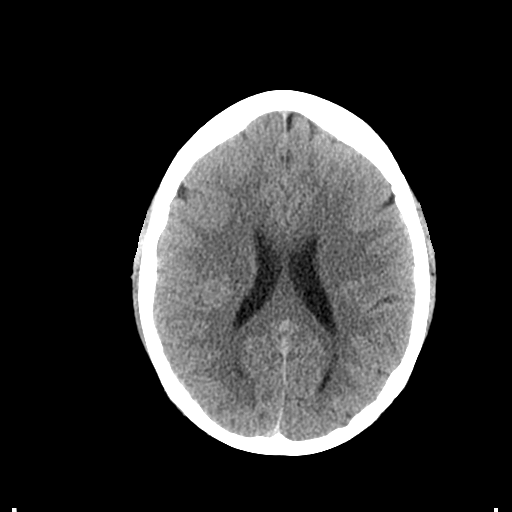
[im 21/32  brain]
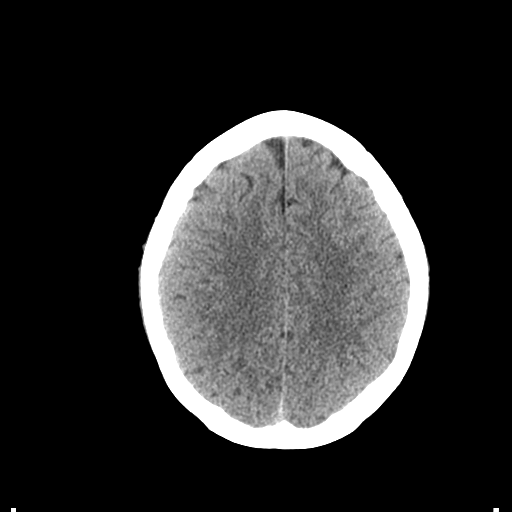
[im 24/32  brain]
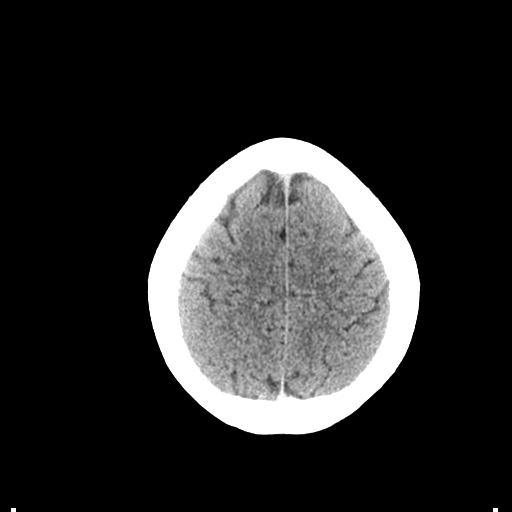
[im 26/32  brain]
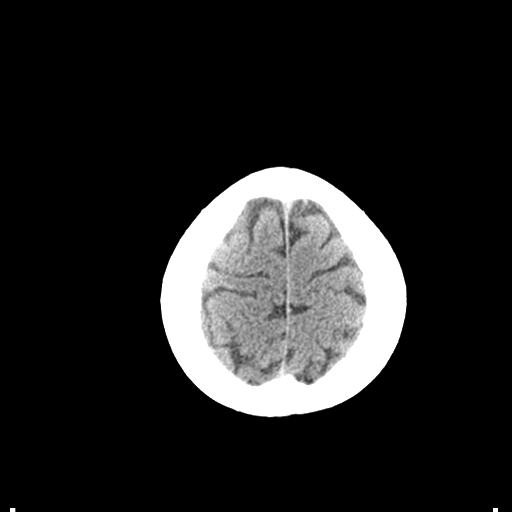
[im 26/32  bone]
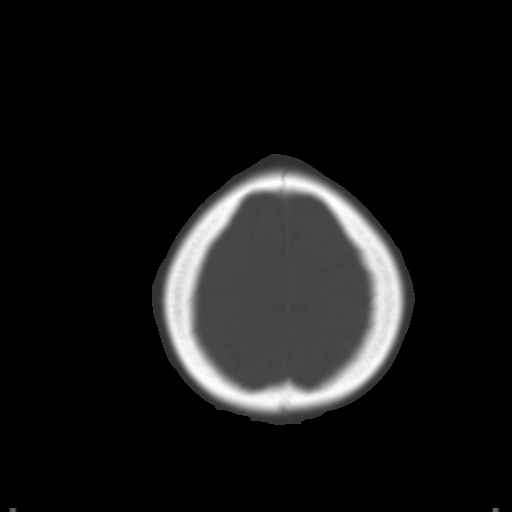
[im 29/32  brain]
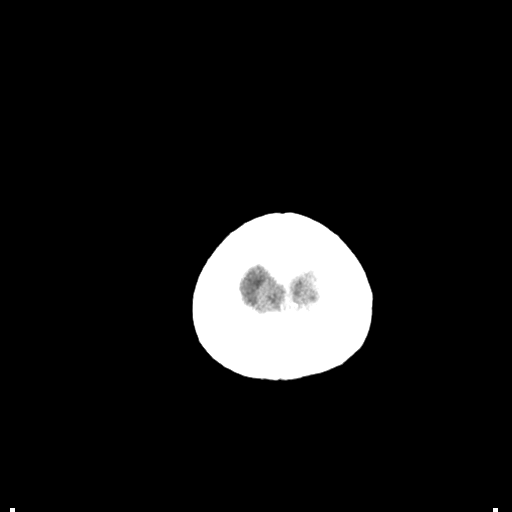

[Series 4: coronal soft tissue · coronal · 0.32mm/px · 3 of 67 slices shown]
[im 23/67  brain]
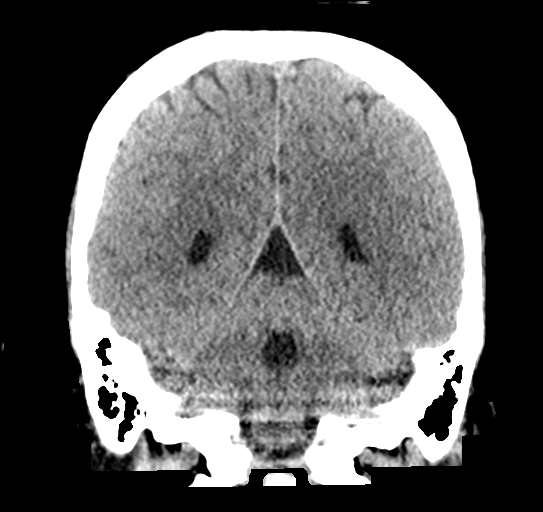
[im 30/67  brain]
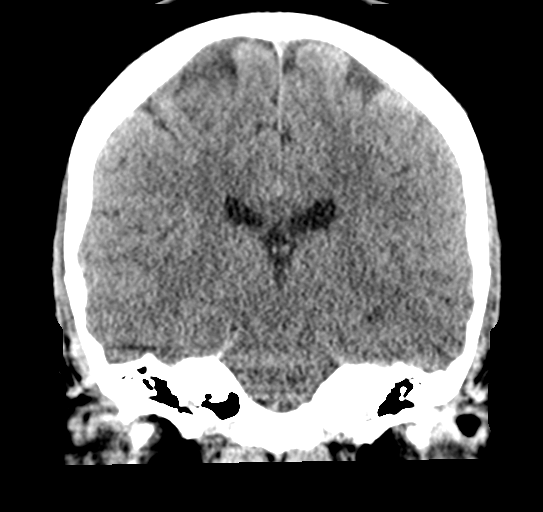
[im 37/67  brain]
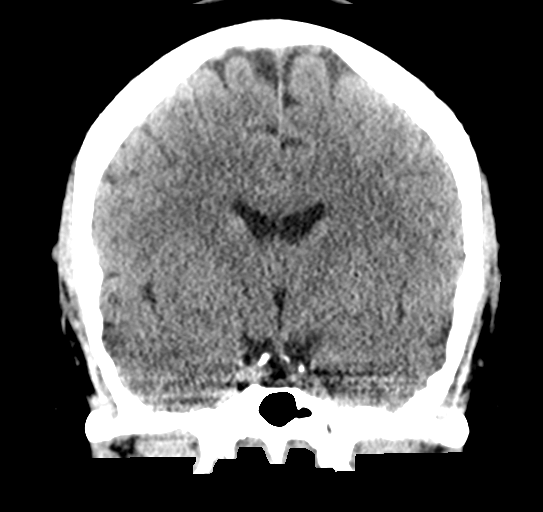

[Series 5: sagittal soft tissue · sagittal · 0.31mm/px · 3 of 59 slices shown]
[im 20/59  brain]
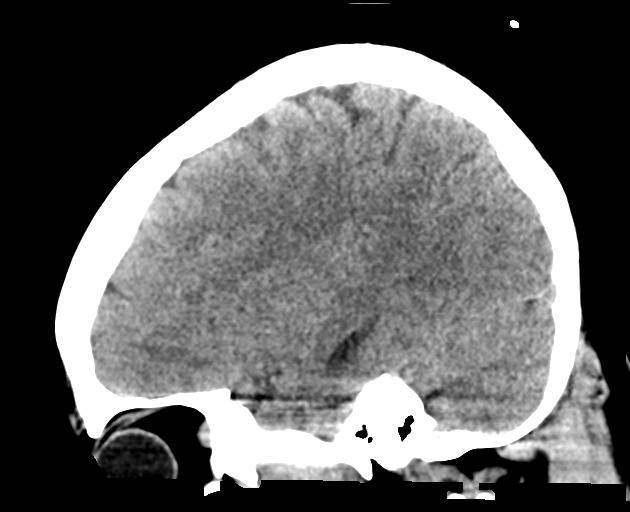
[im 30/59  brain]
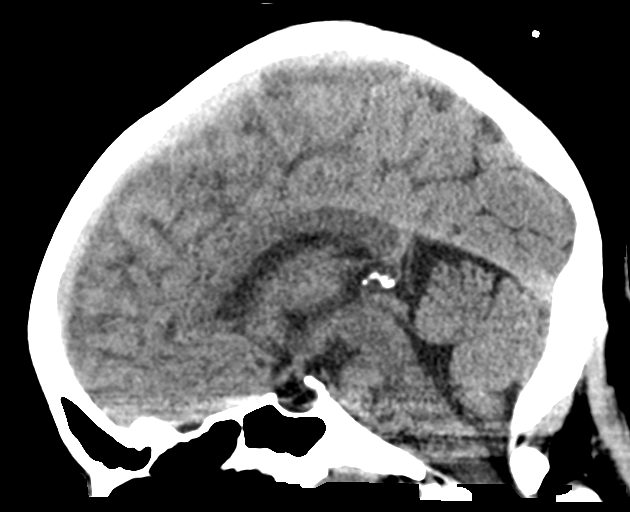
[im 39/59  brain]
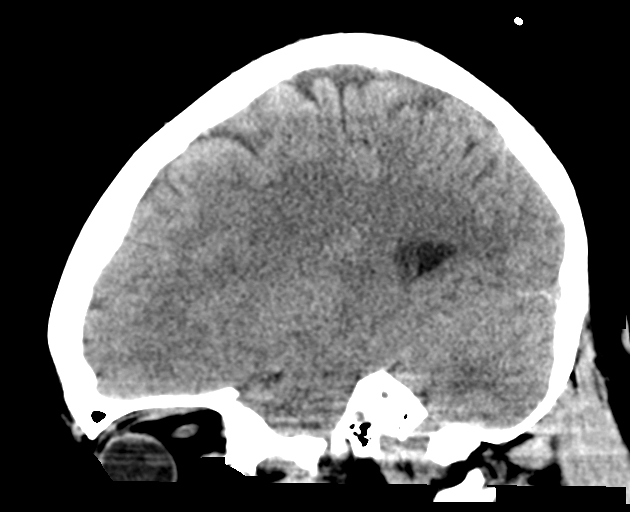

[16 of 47 positions shown; findings below may reference images not displayed]

FINDINGS: Brain: Normal ventricular morphology. No midline shift or mass
effect. Normal appearance of brain parenchyma. No intracranial
hemorrhage, mass lesion, evidence of acute infarction, or
extra-axial fluid collection.

Vascular: Normal appearance

Skull: Intact

Sinuses/Orbits: Clear

Other: N/A
IMPRESSION: Normal exam.

## 2020-06-29 ENCOUNTER — Other Ambulatory Visit: Payer: Self-pay

## 2020-06-29 ENCOUNTER — Emergency Department: Payer: No Typology Code available for payment source

## 2020-06-29 ENCOUNTER — Encounter: Payer: Self-pay | Admitting: *Deleted

## 2020-06-29 DIAGNOSIS — R079 Chest pain, unspecified: Secondary | ICD-10-CM | POA: Diagnosis present

## 2020-06-29 DIAGNOSIS — M940 Chondrocostal junction syndrome [Tietze]: Secondary | ICD-10-CM | POA: Diagnosis not present

## 2020-06-29 LAB — CBC
HCT: 37.5 % (ref 36.0–46.0)
Hemoglobin: 12.4 g/dL (ref 12.0–15.0)
MCH: 29.8 pg (ref 26.0–34.0)
MCHC: 33.1 g/dL (ref 30.0–36.0)
MCV: 90.1 fL (ref 80.0–100.0)
Platelets: 312 10*3/uL (ref 150–400)
RBC: 4.16 MIL/uL (ref 3.87–5.11)
RDW: 14 % (ref 11.5–15.5)
WBC: 13.5 10*3/uL — ABNORMAL HIGH (ref 4.0–10.5)
nRBC: 0 % (ref 0.0–0.2)

## 2020-06-29 LAB — BASIC METABOLIC PANEL
Anion gap: 9 (ref 5–15)
BUN: 14 mg/dL (ref 6–20)
CO2: 26 mmol/L (ref 22–32)
Calcium: 9.1 mg/dL (ref 8.9–10.3)
Chloride: 103 mmol/L (ref 98–111)
Creatinine, Ser: 0.86 mg/dL (ref 0.44–1.00)
GFR, Estimated: >60 mL/min (ref >60)
Glucose, Bld: 96 mg/dL (ref 70–99)
Potassium: 4 mmol/L (ref 3.5–5.1)
Sodium: 138 mmol/L (ref 135–145)

## 2020-06-29 LAB — TROPONIN I (HIGH SENSITIVITY): Troponin I (High Sensitivity): <2 ng/L (ref <18)

## 2020-06-29 NOTE — ED Triage Notes (Signed)
Ppt ambulatory to triage.  Pt has chest pain.  Reports pain in center of chest.  No n/v/d   Pt alert speech clear.

## 2020-06-30 ENCOUNTER — Emergency Department
Admission: EM | Admit: 2020-06-30 | Discharge: 2020-06-30 | Disposition: A | Discharge status: Home / Self Care | Payer: No Typology Code available for payment source | Attending: Emergency Medicine | Admitting: Emergency Medicine

## 2020-06-30 DIAGNOSIS — M940 Chondrocostal junction syndrome [Tietze]: Secondary | ICD-10-CM | POA: Diagnosis not present

## 2020-06-30 DIAGNOSIS — R079 Chest pain, unspecified: Secondary | ICD-10-CM

## 2020-06-30 LAB — HEPATIC FUNCTION PANEL
ALT: 17 U/L (ref 0–44)
AST: 16 U/L (ref 15–41)
Albumin: 4.1 g/dL (ref 3.5–5.0)
Alkaline Phosphatase: 77 U/L (ref 38–126)
Bilirubin, Direct: <0.1 mg/dL (ref 0.0–0.2)
Total Bilirubin: 0.5 mg/dL (ref 0.3–1.2)
Total Protein: 7.9 g/dL (ref 6.5–8.1)

## 2020-06-30 LAB — TROPONIN I (HIGH SENSITIVITY): Troponin I (High Sensitivity): <2 ng/L (ref <18)

## 2020-06-30 LAB — LIPASE, BLOOD: Lipase: 30 U/L (ref 11–51)

## 2020-06-30 LAB — FIBRIN DERIVATIVES D-DIMER (ARMC ONLY): Fibrin derivatives D-dimer (ARMC): 272.77 ng/mL (FEU) (ref 0.00–499.00)

## 2020-06-30 MED ORDER — NAPROXEN 500 MG PO TABS: 500.0000 mg | ORAL_TABLET | Freq: Two times a day (BID) | ORAL | 0 refills | Status: DC

## 2020-06-30 MED ORDER — KETOROLAC TROMETHAMINE 30 MG/ML IJ SOLN
10.0000 mg | Freq: Once | INTRAMUSCULAR | Status: AC
  Administered 2020-06-30: 9.9 mg via INTRAVENOUS
  Filled 2020-06-30: qty 1

## 2020-06-30 NOTE — Discharge Instructions (Addendum)
You may take Naprosyn twice daily as needed for pain.  Apply moist heat to affected area several times daily.  Return to the ER for worsening symptoms, persistent vomiting, difficulty breathing or other concerns.

## 2020-06-30 NOTE — ED Provider Notes (Signed)
[facility] Emergency Department Provider Note   ____________________________________________   First MD Initiated Contact with Patient 06/30/20 0132     (approximate)  I have reviewed the triage vital signs and the nursing notes.   HISTORY  Chief Complaint Chest Pain    HPI Kelli Rocha is a 29 y.o. female who presents to the ED from home with a chief complaint of chest pain.  Patient reports central chest soreness while shopping last evening.  Not associated with diaphoresis, shortness of breath, palpitations, nausea/vomiting or dizziness.  Denies recent fever, cough, abdominal pain, diarrhea.  Denies recent travel or trauma. + Hormone use from birth control.     Past Medical History:  Diagnosis Date  . Hx of chlamydia infection     Patient Active Problem List   Diagnosis Date Noted  . Normal delivery 04/19/2012    Past Surgical History:  Procedure Laterality Date  . MYRINGOTOMY      Prior to Admission medications   Medication Sig Start Date End Date Taking? Authorizing Provider  levothyroxine (SYNTHROID) 100 MCG tablet Take 100 mcg by mouth daily before breakfast.    [provider]  naproxen (NAPROSYN) 500 MG tablet Take 1 tablet (500 mg total) by mouth 2 (two) times daily with a meal. 06/30/20   Irean Hong, MD    Allergies Tramadol  Family History  Problem Relation Age of Onset  . Arthritis Mother   . Depression Mother   . Diabetes Mother   . Hearing loss Mother   . Hypertension Mother   . Kidney disease Mother   . Hypertension Father     Social History Social History   Tobacco Use  . Smoking status: Never Smoker  . Smokeless tobacco: Never Used  Substance Use Topics  . Alcohol use: No    Comment: occ  . Drug use: No    Review of Systems  Constitutional: No fever/chills Eyes: No visual changes. ENT: No sore throat. Cardiovascular: Positive for chest pain. Respiratory: Denies shortness of  breath. Gastrointestinal: No abdominal pain.  No nausea, no vomiting.  No diarrhea.  No constipation. Genitourinary: Negative for dysuria. Musculoskeletal: Negative for back pain. Skin: Negative for rash. Neurological: Negative for headaches, focal weakness or numbness.   ____________________________________________   PHYSICAL EXAM:  VITAL SIGNS: ED Triage Vitals  Enc Vitals Group     BP 06/29/20 2121 (!) 142/93     Pulse Rate 06/29/20 2118 77     Resp 06/29/20 2118 18     Temp 06/29/20 2118 98.7 F (37.1 C)     Temp Source 06/29/20 2118 Oral     SpO2 06/29/20 2118 98 %     Weight 06/29/20 2119 (!) 305 lb (138.3 kg)     Height 06/29/20 2119 5\' 5"  (1.651 m)     Head Circumference --      Peak Flow --      Pain Score 06/29/20 2119 8     Pain Loc --      Pain Edu? --      Excl. in [place]? --     Constitutional: Alert and oriented. Well appearing and in no acute distress. Eyes: Conjunctivae are normal. PERRL. EOMI. Head: Atraumatic. Nose: No congestion/rhinnorhea. Mouth/Throat: Mucous membranes are moist.   Neck: No stridor.   Cardiovascular: Normal rate, regular rhythm. Grossly normal heart sounds.  Good peripheral circulation. Respiratory: Normal respiratory effort.  No retractions. Lungs CTAB.   Gastrointestinal: Soft and nontender to light or  deep palpation. No distention. No abdominal bruits. No CVA tenderness. Musculoskeletal: No lower extremity tenderness nor edema.  No joint effusions. Neurologic:  Normal speech and language. No gross focal neurologic deficits are appreciated. No gait instability. Skin:  Skin is warm, dry and intact. No rash noted. Psychiatric: Mood and affect are normal. Speech and behavior are normal.  ____________________________________________   LABS (all labs ordered are listed, but only abnormal results are displayed)  Labs Reviewed  CBC - Abnormal; Notable for the following components:      Result Value   WBC 13.5 (*)    All other  components within normal limits  BASIC METABOLIC PANEL  FIBRIN DERIVATIVES D-DIMER ([facility] ONLY)  HEPATIC FUNCTION PANEL  LIPASE, BLOOD  TROPONIN I (HIGH SENSITIVITY)  TROPONIN I (HIGH SENSITIVITY)   ____________________________________________  EKG  ED ECG REPORT I, Amee Boothe J, the attending physician, personally viewed and interpreted this ECG.   Date: 06/30/2020  EKG Time: 2116  Rate: 77  Rhythm: normal EKG, normal sinus rhythm  Axis: Normal  Intervals:none  ST&T Change: Nonspecific  ____________________________________________  RADIOLOGY I, Danine Hor J, personally viewed and evaluated these images (plain radiographs) as part of my medical decision making, as well as reviewing the written report by the radiologist.  ED MD interpretation: No acute cardiopulmonary process  Official radiology report(s): DG Chest 2 View  Result Date: 06/29/2020 CLINICAL DATA:  Chest pain. EXAM: CHEST - 2 VIEW COMPARISON:  Chest x-ray 07/15/2017 FINDINGS: The heart size and mediastinal contours are within normal limits. No focal consolidation. No pulmonary edema. No pleural effusion. No pneumothorax. No acute osseous abnormality. IMPRESSION: No active cardiopulmonary disease. Electronically Signed   By: Tish Frederickson M.D.   On: 06/29/2020 21:50    ____________________________________________   PROCEDURES  Procedure(s) performed (including Critical Care):  Procedures   ____________________________________________   INITIAL IMPRESSION / ASSESSMENT AND PLAN / ED COURSE  As part of my medical decision making, I reviewed the following data within the electronic MEDICAL RECORD NUMBER Nursing notes reviewed and incorporated, Labs reviewed, EKG interpreted, Old chart reviewed, Radiograph reviewed and Notes from prior ED visits (multiple visits for chest pain since 407)     29 year old otherwise healthy female presenting with chest pain. Differential diagnosis includes, but is not limited to,  ACS, aortic dissection, pulmonary embolism, cardiac tamponade, pneumothorax, pneumonia, pericarditis, myocarditis, GI-related causes including esophagitis/gastritis, and musculoskeletal chest wall pain.    Cardiac work-up including 2 sets of troponins unremarkable.  Will check D-dimer given patient has hormone use.  Administer Toradol for pain.  Will reassess.  Clinical Course as of Jun 30 628  Tue Jun 30, 2020  0256 Pain improved.  Patient resting comfortably no acute distress.  Updated her on negative D-dimer result.  Will discharge home with prescriptions for Naprosyn to use as needed.  Strict return precautions given.  Patient verbalizes understanding agrees with plan of care.   [JS]    Clinical Course User Index [JS] Irean Hong, MD     ____________________________________________   FINAL CLINICAL IMPRESSION(S) / ED DIAGNOSES  Final diagnoses:  Nonspecific chest pain  Costochondritis     ED Discharge Orders         Ordered    naproxen (NAPROSYN) 500 MG tablet  2 times daily with meals        06/30/20 0258          *Please note:  Shoshanah Dozal was evaluated in Emergency Department on 06/30/2020 for the symptoms described in the  history of present illness. She was evaluated in the context of the global COVID-19 pandemic, which necessitated consideration that the patient might be at risk for infection with the SARS-CoV-2 virus that causes COVID-19. Institutional protocols and algorithms that pertain to the evaluation of patients at risk for COVID-19 are in a state of rapid change based on information released by regulatory bodies including the CDC and federal and state organizations. These policies and algorithms were followed during the patient's care in the ED.  Some ED evaluations and interventions may be delayed as a result of limited staffing during and the pandemic.*   Note:  This document was prepared using Dragon voice recognition software and may include  unintentional dictation errors.   Irean Hong, MD 06/30/20 [phone]

## 2020-07-25 ENCOUNTER — Emergency Department: Payer: No Typology Code available for payment source

## 2020-07-25 ENCOUNTER — Encounter: Payer: Self-pay | Admitting: Emergency Medicine

## 2020-07-25 ENCOUNTER — Emergency Department
Admission: EM | Admit: 2020-07-25 | Discharge: 2020-07-25 | Disposition: A | Discharge status: Home / Self Care | Payer: No Typology Code available for payment source | Attending: Emergency Medicine | Admitting: Emergency Medicine

## 2020-07-25 ENCOUNTER — Other Ambulatory Visit: Payer: Self-pay

## 2020-07-25 DIAGNOSIS — R531 Weakness: Secondary | ICD-10-CM | POA: Diagnosis present

## 2020-07-25 DIAGNOSIS — U071 COVID-19: Secondary | ICD-10-CM

## 2020-07-25 NOTE — ED Notes (Signed)
Pt ambulating in room with O2 monitor on. Pt with steady gait.

## 2020-07-25 NOTE — ED Notes (Signed)
Pt alert and oriented, resting in bed. No noted SOB. Pt states she feels weak due to testing positive for covid yesterday.

## 2020-07-25 NOTE — Discharge Instructions (Addendum)
Follow-up with your primary care provider if any continued problems by calling them.  If any severe worsening of your symptoms or shortness of breath difficulty breathing return to the emergency department.  Continue to drink fluids, take Tylenol or ibuprofen as needed for muscle aches, fever or headache.  Walk around your home frequently and take deep breaths to prevent pneumonia.  Also you should be quarantining in your home for the next 14 days along with anyone that you have been closely exposed to.

## 2020-07-25 NOTE — ED Triage Notes (Addendum)
Pt arrived via POV with reports of being dx with COVID yesterday at [facility], pt c/o shortness of breath, HA, feeling dehydrated. No distress noted. Pt able to speak in complete sentences without running out of breath.  Pt has positive COVID results on phone

## 2020-07-25 NOTE — ED Provider Notes (Signed)
[facility] Emergency Department Provider Note  ___________________________________________   Event Date/Time   First MD Initiated Contact with Patient 07/25/20 1028     (approximate)  I have reviewed the triage vital signs and the nursing notes.   HISTORY  Chief Complaint Covid Positive   HPI Kelli Rocha is a 29 y.o. female presents to the ED with complaint of feeling weak, headache, shortness of breath and feeling "dehydrated".  Patient reports that she was tested at [facility] on [address] yesterday and was positive for Covid.  Patient denies any previous history of pneumonia or asthma.  Patient is a non-smoker.  She reports that she got the Mederma vaccine.       Past Medical History:  Diagnosis Date  . Hx of chlamydia infection     Patient Active Problem List   Diagnosis Date Noted  . Normal delivery 04/19/2012    Past Surgical History:  Procedure Laterality Date  . MYRINGOTOMY      Prior to Admission medications   Medication Sig Start Date End Date Taking? Authorizing Provider  levothyroxine (SYNTHROID) 100 MCG tablet Take 100 mcg by mouth daily before breakfast.    [provider]    Allergies Tramadol  Family History  Problem Relation Age of Onset  . Arthritis Mother   . Depression Mother   . Diabetes Mother   . Hearing loss Mother   . Hypertension Mother   . Kidney disease Mother   . Hypertension Father     Social History Social History   Tobacco Use  . Smoking status: Never Smoker  . Smokeless tobacco: Never Used  Substance Use Topics  . Alcohol use: No    Comment: occ  . Drug use: No    Review of Systems Constitutional: No fever/chills Eyes: No visual changes. ENT: No sore throat. Cardiovascular: Denies chest pain. Respiratory: Positive for shortness of breath. Gastrointestinal: No abdominal pain.  No nausea, no vomiting.  No diarrhea.    Genitourinary: Negative for dysuria. Musculoskeletal:  Negative for back pain. Skin: Negative for rash. Neurological: Positive for headache.  Negative for focal weakness or numbness. ____________________________________________   PHYSICAL EXAM:  VITAL SIGNS: ED Triage Vitals  Enc Vitals Group     BP 07/25/20 0958 119/89     Pulse Rate 07/25/20 0958 88     Resp 07/25/20 0958 20     Temp 07/25/20 0958 98.4 F (36.9 C)     Temp Source 07/25/20 0958 Oral     SpO2 07/25/20 0958 95 %     Weight 07/25/20 0951 (!) 305 lb (138.3 kg)     Height 07/25/20 0951 5\' 5"  (1.651 m)     Head Circumference --      Peak Flow --      Pain Score 07/25/20 0951 0     Pain Loc --      Pain Edu? --      Excl. in [place]? --     Constitutional: Alert and oriented. Well appearing and in no acute distress.  Patient is able to speak in complete sentences without any difficulty or apparent shortness of breath. Eyes: Conjunctivae are normal.  Head: Atraumatic. Nose: No congestion/rhinnorhea. Neck: No stridor.   Cardiovascular: Normal rate, regular rhythm. Grossly normal heart sounds.  Good peripheral circulation. Respiratory: Normal respiratory effort.  No retractions. Lungs CTAB. Gastrointestinal: Soft and nontender. No distention. Musculoskeletal: Is able to move upper and lower extremities without any difficulty. Neurologic:  Normal speech and  language. No gross focal neurologic deficits are appreciated. No gait instability. Skin:  Skin is warm, dry and intact. No rash noted. Psychiatric: Mood and affect are normal. Speech and behavior are normal.  ____________________________________________   LABS (all labs ordered are listed, but only abnormal results are displayed)  Labs Reviewed - No data to display ____________________________________________   RADIOLOGY I, Tommi Rumps, personally viewed and evaluated these images (plain radiographs) as part of my medical decision making, as well as reviewing the written report by the radiologist.  Official  radiology report(s): DG Chest 2 View  Result Date: 07/25/2020 CLINICAL DATA:  COVID positive.  LEFT-sided chest pain. EXAM: CHEST - 2 VIEW COMPARISON:  June 29, 2020 FINDINGS: The cardiomediastinal silhouette is normal in contour. No pleural effusion. No pneumothorax. No acute pleuroparenchymal abnormality. Visualized abdomen is unremarkable. No acute osseous abnormality noted. IMPRESSION: No acute cardiopulmonary abnormality. Electronically Signed   By: Meda Klinefelter MD   On: 07/25/2020 11:21    ____________________________________________   PROCEDURES  Procedure(s) performed (including Critical Care):  Procedures   ____________________________________________   INITIAL IMPRESSION / ASSESSMENT AND PLAN / ED COURSE  As part of my medical decision making, I reviewed the following data within the electronic MEDICAL RECORD NUMBER Notes from prior ED visits and [place] Controlled Substance Database  29 year old female presents to the ED with complaint of feeling tired, shortness of breath, weakness, headache and feeling "dehydrated".  Patient was [facility] and has the test results with her.  Patient is able to talk in complete sentences without any difficulty.  When she was ambulated her O2 sat was between 97% to 99%.  Patient was made aware that her chest x-ray is negative for any pneumonia at this time.  She is encouraged to take deep breaths and walk about the house frequently  to prevent pneumonia.  Patient also is aware that she needs to quarantine in her home and also notify people that she has been around that she is positive for Covid.  She was able to drink 3 cups of water while in the ED without any difficulty or vomiting.  Patient is encouraged to continue drinking fluids at home take Tylenol or ibuprofen as needed for muscle aches, headache or fever.  Patient is return to the emergency department if any severe worsening of her breathing or difficulty,  otherwise she is to follow-up with her primary care provider. ____________________________________________   FINAL CLINICAL IMPRESSION(S) / ED DIAGNOSES  Final diagnoses:  COVID-19     ED Discharge Orders    None      *Please note:  Annika Baskin was evaluated in Emergency Department on 07/25/2020 for the symptoms described in the history of present illness. She was evaluated in the context of the global COVID-19 pandemic, which necessitated consideration that the patient might be at risk for infection with the SARS-CoV-2 virus that causes COVID-19. Institutional protocols and algorithms that pertain to the evaluation of patients at risk for COVID-19 are in a state of rapid change based on information released by regulatory bodies including the CDC and federal and state organizations. These policies and algorithms were followed during the patient's care in the ED.  Some ED evaluations and interventions may be delayed as a result of limited staffing during and the pandemic.*   Note:  This document was prepared using Dragon voice recognition software and may include unintentional dictation errors.    Tommi Rumps, PA-C 07/25/20 1223  Delton [place], MD 07/25/20 [phone]

## 2020-07-25 NOTE — ED Notes (Signed)
Pt walked in room for over 5 minutes, pt O2 sats remain between 97-100%. Pt denies SOB but states she feels tired.

## 2021-10-28 IMAGING — CR DG CHEST 2V
1 series · 2 of 2 positions shown · non-contrast
Comparison: Chest x-ray 07/15/2017

CLINICAL DATA: Chest pain.

EXAM:
CHEST - 2 VIEW

[Series 1: dg chest 2 view · 0.14mm/px · 2 of 2 slices shown]
[im 1/2]
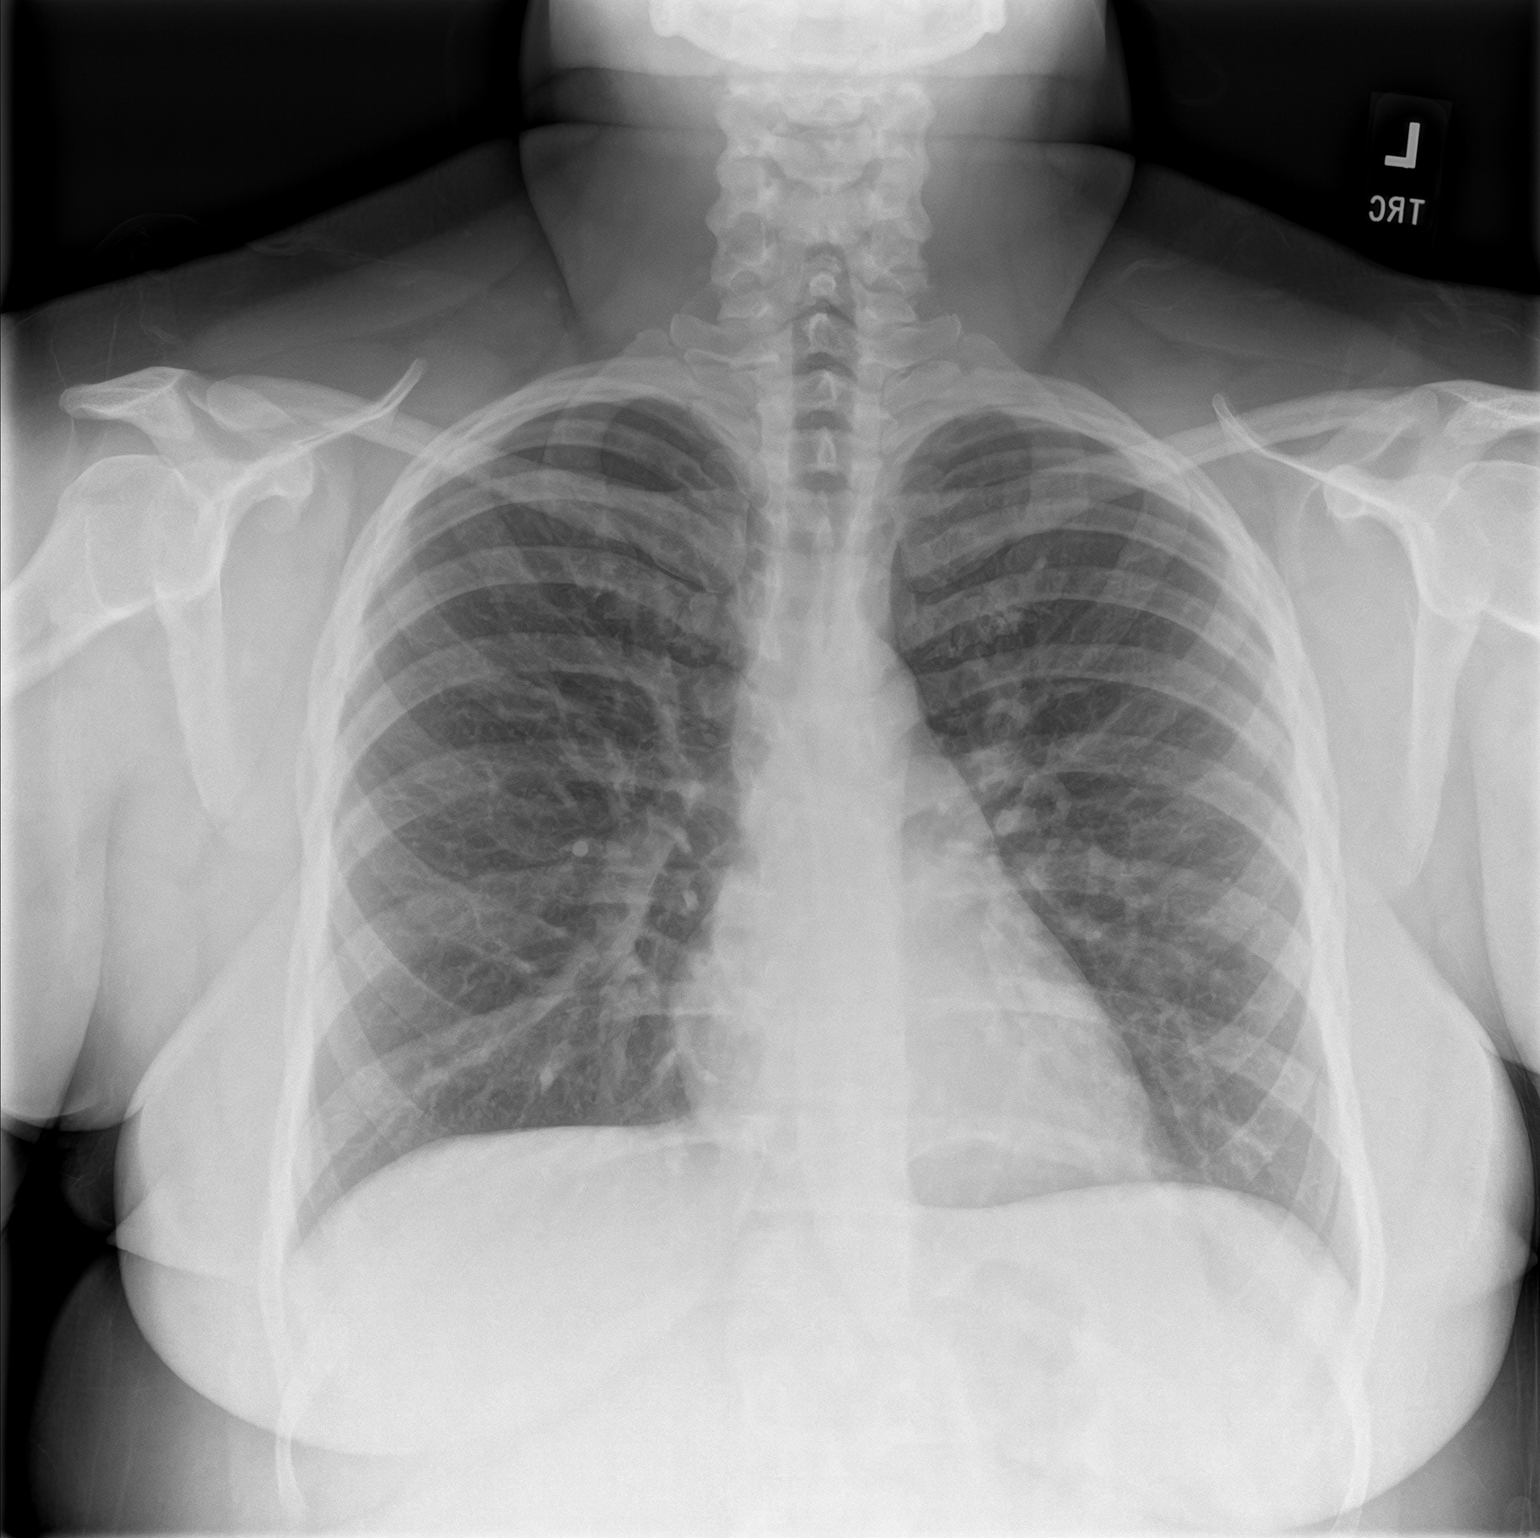
[im 2/2]
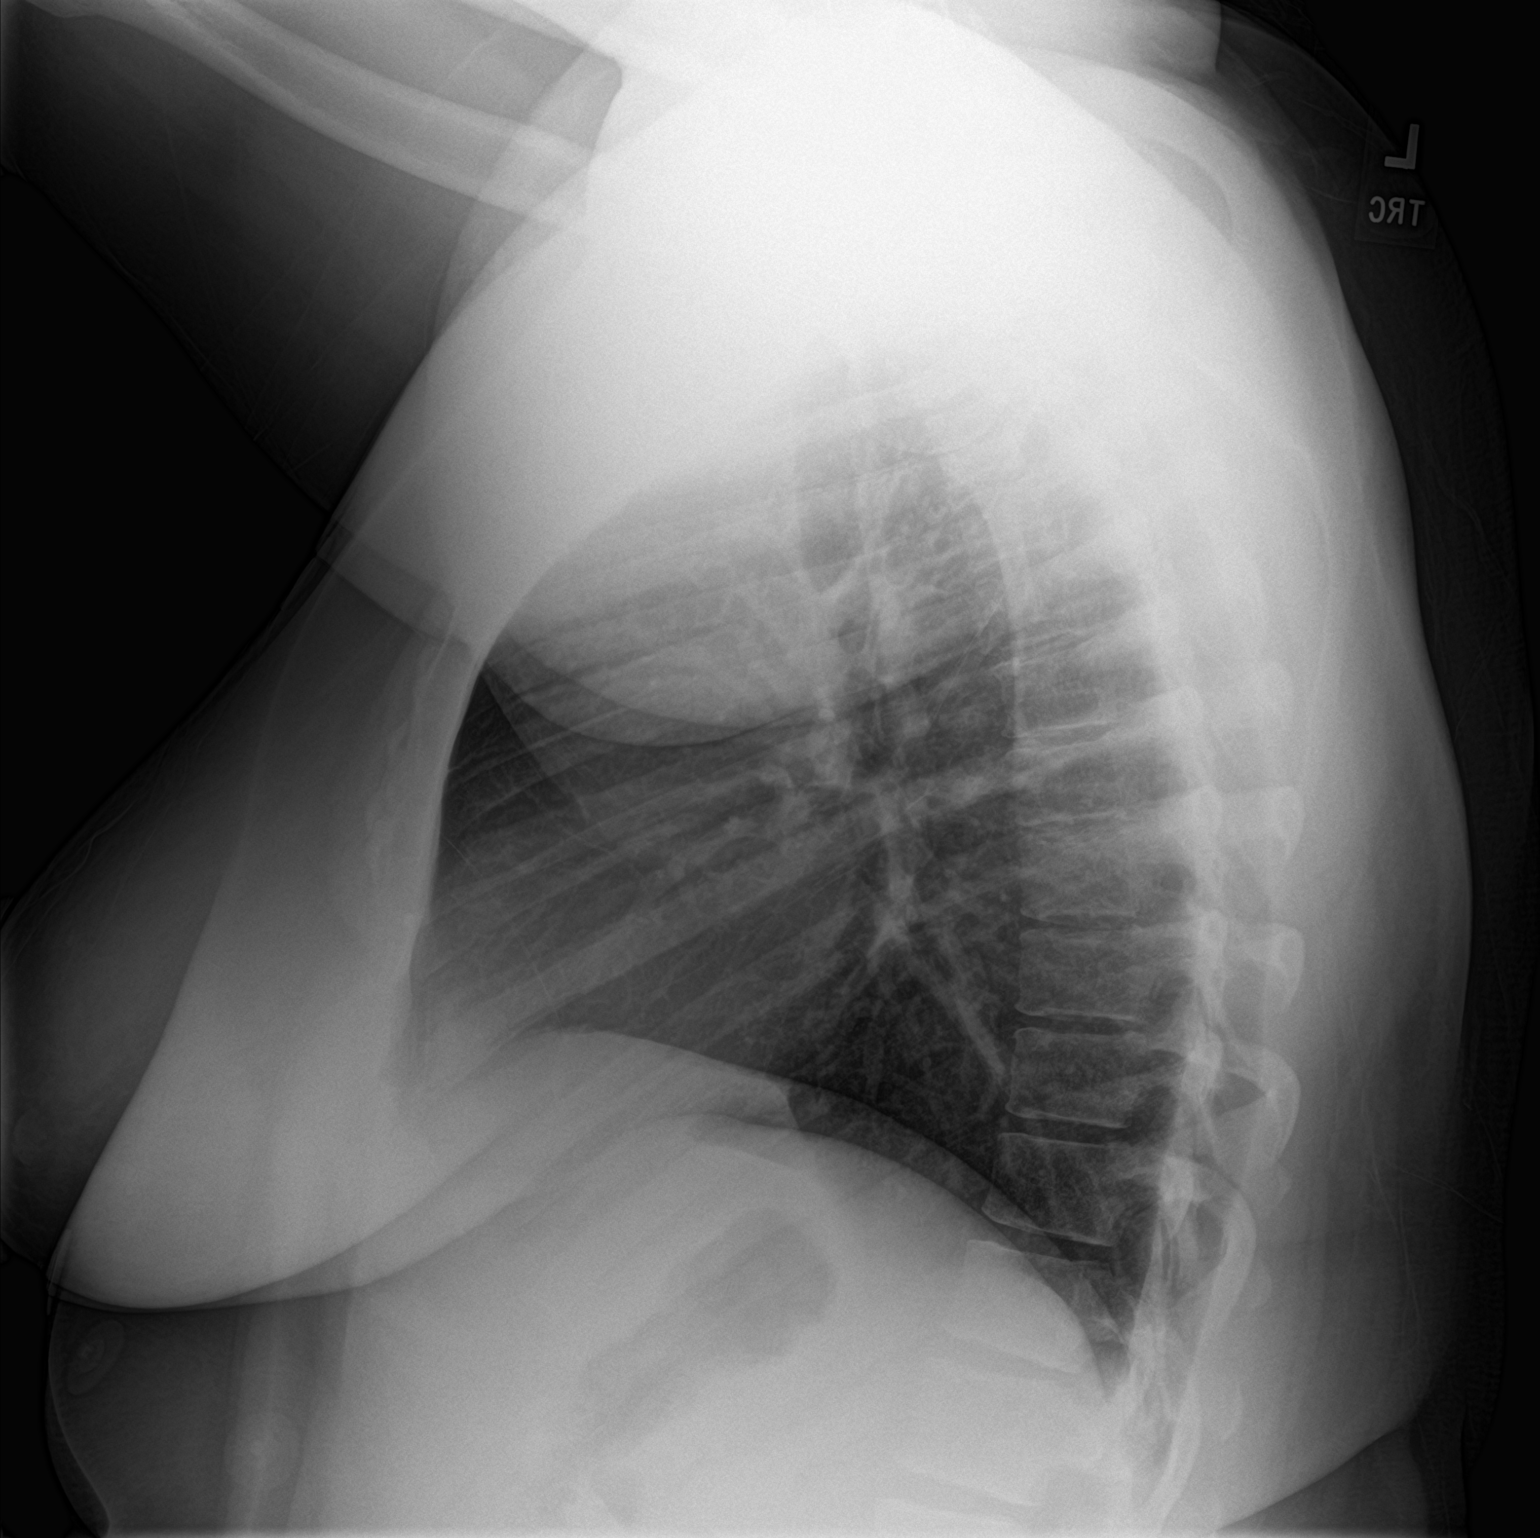

[2 of 2 positions shown; findings below may reference images not displayed]

FINDINGS: The heart size and mediastinal contours are within normal limits.

No focal consolidation. No pulmonary edema. No pleural effusion. No
pneumothorax.

No acute osseous abnormality.
IMPRESSION: No active cardiopulmonary disease.

## 2022-11-08 ENCOUNTER — Telehealth: Payer: No Typology Code available for payment source

## 2022-11-08 ENCOUNTER — Ambulatory Visit
Admission: EM | Admit: 2022-11-08 | Discharge: 2022-11-08 | Disposition: A | Discharge status: Home / Self Care | Payer: No Typology Code available for payment source | Attending: Urgent Care | Admitting: Urgent Care

## 2022-11-08 DIAGNOSIS — J069 Acute upper respiratory infection, unspecified: Secondary | ICD-10-CM | POA: Diagnosis not present

## 2022-11-08 LAB — POCT FASTING CBG KUC MANUAL ENTRY: POCT Glucose (KUC): 105 mg/dL — AB (ref 70–99)

## 2022-11-08 NOTE — Discharge Instructions (Addendum)
Recommend continued use of OTC medication to control your symptoms.  Get plenty of rest and drink fluids.

## 2022-11-08 NOTE — ED Triage Notes (Addendum)
Patient to Urgent Care with complaints of cough/ body aches/ dry sinuses/ dry nose/ dry mouth/ shortness of breath. Has been experiencing some dizziness when moving around.   Symptoms started three days ago. Has experiencing some shortness of breath today and a productive cough. Lower back pain. Seasonal allergies- taking generic zyrtec/ benadryl/ hot tea w/ honey. Describes feeling dehydrated and has been pushing fluids.  Recently exposed to her grandmother who has pneumonia.

## 2022-11-08 NOTE — ED Provider Notes (Signed)
Roderic Palau    CSN: [id number] Arrival date & time: 11/08/22  1156      History   Chief Complaint Chief Complaint  Patient presents with   Cough    Cough, sneezing, congestion, body aches, shortness of breath - Entered by patient    HPI Leetta Osier is a 32 y.o. female.    Cough   Patient presents to urgent care with symptoms x 3 days.  She endorses cough, nasal congestion, body aches, "dry sinuses", "dry nose and mouth", shortness of breath.  Reports some dizziness.  Cough is productive.  Also complains of lower back pain.  She reports history of seasonal allergies and using antihistamines.  She reports an exposure to her grandmother who has pneumonia.  Past Medical History:  Diagnosis Date   Hx of chlamydia infection     Patient Active Problem List   Diagnosis Date Noted   Normal delivery 04/19/2012    Past Surgical History:  Procedure Laterality Date   MYRINGOTOMY      OB History     Gravida  1   Para  1   Term  1   Preterm  0   AB  0   Living  1      SAB  0   IAB  0   Ectopic  0   Multiple  0   Live Births  1            Home Medications    Prior to Admission medications   Medication Sig Start Date End Date Taking? Authorizing Provider  levothyroxine (SYNTHROID) 100 MCG tablet Take 100 mcg by mouth daily before breakfast.    [provider]    Family History Family History  Problem Relation Age of Onset   Arthritis Mother    Depression Mother    Diabetes Mother    Hearing loss Mother    Hypertension Mother    Kidney disease Mother    Hypertension Father     Social History Social History   Tobacco Use   Smoking status: Never   Smokeless tobacco: Never  Substance Use Topics   Alcohol use: No    Comment: occ   Drug use: No     Allergies   Tramadol   Review of Systems Review of Systems  Respiratory:  Positive for cough.      Physical Exam Triage Vital Signs ED Triage Vitals  Enc  Vitals Group     BP      Pulse      Resp      Temp      Temp src      SpO2      Weight      Height      Head Circumference      Peak Flow      Pain Score      Pain Loc      Pain Edu?      Excl. in [place]?    No data found.  Updated Vital Signs There were no vitals taken for this visit.  Visual Acuity Right Eye Distance:   Left Eye Distance:   Bilateral Distance:    Right Eye Near:   Left Eye Near:    Bilateral Near:     Physical Exam Vitals reviewed.  Constitutional:      Appearance: Normal appearance. She is ill-appearing.  HENT:     Right Ear: Tympanic membrane normal.     Left Ear:  Tympanic membrane normal.     Mouth/Throat:     Mouth: Mucous membranes are moist.     Pharynx: No oropharyngeal exudate or posterior oropharyngeal erythema.  Cardiovascular:     Rate and Rhythm: Normal rate and regular rhythm.     Pulses: Normal pulses.     Heart sounds: Normal heart sounds.  Pulmonary:     Effort: Pulmonary effort is normal.     Breath sounds: Normal breath sounds.  Skin:    General: Skin is warm and dry.  Neurological:     General: No focal deficit present.     Mental Status: She is alert and oriented to person, place, and time.  Psychiatric:        Mood and Affect: Mood normal.        Behavior: Behavior normal.      UC Treatments / Results  Labs (all labs ordered are listed, but only abnormal results are displayed) Labs Reviewed - No data to display  EKG   Radiology No results found.  Procedures Procedures (including critical care time)  Medications Ordered in UC Medications - No data to display  Initial Impression / Assessment and Plan / UC Course  I have reviewed the triage vital signs and the nursing notes.  Pertinent labs & imaging results that were available during my care of the patient were reviewed by me and considered in my medical decision making (see chart for details).   Patient is afebrile here without recent antipyretics.  Satting well on room air. Overall is ill appearing, well hydrated, without respiratory distress. Pulmonary exam is unremarkable.  Lungs CTAB without wheezing, rhonchi, rales.  Pharyngeal erythema or peritonsillar exudates.  TMs are WNL bilaterally.  Patient's symptoms are consistent with an acute viral process including flu or COVID.  She is beyond the treatment window for flu and nearing the window for COVID and no testing or antiviral therapy was offered.  Recommending continued supportive care including rest and fluids as well as OTC medication for symptom control.  Patient acknowledges understanding and agreement with this treatment plan.  Final Clinical Impressions(s) / UC Diagnoses   Final diagnoses:  None   Discharge Instructions   None    ED Prescriptions   None    PDMP not reviewed this encounter.   Rose Phi, [place] 11/08/22 1315
